# Patient Record
Sex: Male | Born: 1940 | Race: White | Hispanic: No | Marital: Married | Smoking: Never smoker
Health system: Southern US, Community
[De-identification: ages and names within clinical notes are randomized; demographics above are authoritative.]

## PROBLEM LIST (undated history)

## (undated) DIAGNOSIS — E119 Type 2 diabetes mellitus without complications: Secondary | ICD-10-CM

## (undated) DIAGNOSIS — I499 Cardiac arrhythmia, unspecified: Secondary | ICD-10-CM

## (undated) DIAGNOSIS — M179 Osteoarthritis of knee, unspecified: Secondary | ICD-10-CM

## (undated) DIAGNOSIS — I471 Supraventricular tachycardia, unspecified: Secondary | ICD-10-CM

## (undated) DIAGNOSIS — I482 Chronic atrial fibrillation, unspecified: Secondary | ICD-10-CM

## (undated) DIAGNOSIS — R0609 Other forms of dyspnea: Secondary | ICD-10-CM

## (undated) DIAGNOSIS — Z22322 Carrier or suspected carrier of Methicillin resistant Staphylococcus aureus: Secondary | ICD-10-CM

## (undated) DIAGNOSIS — I2510 Atherosclerotic heart disease of native coronary artery without angina pectoris: Secondary | ICD-10-CM

## (undated) DIAGNOSIS — I4892 Unspecified atrial flutter: Secondary | ICD-10-CM

## (undated) DIAGNOSIS — E785 Hyperlipidemia, unspecified: Secondary | ICD-10-CM

## (undated) DIAGNOSIS — M1710 Unilateral primary osteoarthritis, unspecified knee: Secondary | ICD-10-CM

## (undated) DIAGNOSIS — K219 Gastro-esophageal reflux disease without esophagitis: Secondary | ICD-10-CM

## (undated) DIAGNOSIS — I4719 Other supraventricular tachycardia: Secondary | ICD-10-CM

## (undated) DIAGNOSIS — C801 Malignant (primary) neoplasm, unspecified: Secondary | ICD-10-CM

## (undated) DIAGNOSIS — I10 Essential (primary) hypertension: Secondary | ICD-10-CM

## (undated) DIAGNOSIS — R0600 Dyspnea, unspecified: Secondary | ICD-10-CM

## (undated) HISTORY — DX: Other supraventricular tachycardia: I47.19

## (undated) HISTORY — DX: Unspecified atrial flutter: I48.92

## (undated) HISTORY — DX: Gastro-esophageal reflux disease without esophagitis: K21.9

## (undated) HISTORY — DX: Osteoarthritis of knee, unspecified: M17.9

## (undated) HISTORY — DX: Essential (primary) hypertension: I10

## (undated) HISTORY — DX: Chronic atrial fibrillation, unspecified: I48.20

## (undated) HISTORY — DX: Supraventricular tachycardia: I47.1

## (undated) HISTORY — DX: Dyspnea, unspecified: R06.00

## (undated) HISTORY — DX: Other forms of dyspnea: R06.09

## (undated) HISTORY — PX: CARDIAC SURGERY: SHX584

## (undated) HISTORY — DX: Carrier or suspected carrier of methicillin resistant Staphylococcus aureus: Z22.322

## (undated) HISTORY — PX: HEMORRHOID SURGERY: SHX153

## (undated) HISTORY — DX: Atherosclerotic heart disease of native coronary artery without angina pectoris: I25.10

## (undated) HISTORY — PX: CARDIAC ELECTROPHYSIOLOGY STUDY AND ABLATION: SHX1294

## (undated) HISTORY — DX: Supraventricular tachycardia, unspecified: I47.10

## (undated) HISTORY — PX: MOHS SURGERY: SHX181

## (undated) HISTORY — DX: Type 2 diabetes mellitus without complications: E11.9

## (undated) HISTORY — DX: Hyperlipidemia, unspecified: E78.5

## (undated) HISTORY — DX: Unilateral primary osteoarthritis, unspecified knee: M17.10

---

## 1991-04-13 HISTORY — PX: CARDIAC CATHETERIZATION: SHX172

## 1991-04-13 HISTORY — PX: CORONARY ARTERY BYPASS GRAFT: SHX141

## 2003-10-03 ENCOUNTER — Other Ambulatory Visit: Payer: Self-pay

## 2003-11-01 ENCOUNTER — Other Ambulatory Visit: Payer: Self-pay

## 2003-11-18 ENCOUNTER — Other Ambulatory Visit: Payer: Self-pay

## 2008-06-09 ENCOUNTER — Emergency Department: Payer: Self-pay | Admitting: Emergency Medicine

## 2008-06-13 ENCOUNTER — Ambulatory Visit: Payer: Self-pay | Admitting: Cardiology

## 2008-06-17 ENCOUNTER — Ambulatory Visit: Payer: Self-pay | Admitting: Internal Medicine

## 2008-06-24 ENCOUNTER — Ambulatory Visit: Payer: Self-pay

## 2008-06-24 ENCOUNTER — Encounter: Payer: Self-pay | Admitting: Cardiology

## 2008-06-24 ENCOUNTER — Ambulatory Visit: Payer: Self-pay | Admitting: Cardiology

## 2008-06-24 LAB — CONVERTED CEMR LAB
BUN: 16 mg/dL (ref 6–23)
Basophils Relative: 0.6 % (ref 0.0–3.0)
Cholesterol: 179 mg/dL (ref 0–200)
Creatinine, Ser: 1 mg/dL (ref 0.4–1.5)
Eosinophils Absolute: 0.1 10*3/uL (ref 0.0–0.7)
Eosinophils Relative: 3 % (ref 0.0–5.0)
GFR calc Af Amer: 96 mL/min
GFR calc non Af Amer: 79 mL/min
Glucose, Bld: 107 mg/dL — ABNORMAL HIGH (ref 70–99)
HCT: 39.1 % (ref 39.0–52.0)
Hemoglobin: 13.6 g/dL (ref 13.0–17.0)
INR: 2.1 — ABNORMAL HIGH (ref 0.8–1.0)
LDL Cholesterol: 119 mg/dL — ABNORMAL HIGH (ref 0–99)
MCV: 90.2 fL (ref 78.0–100.0)
Monocytes Absolute: 0.5 10*3/uL (ref 0.1–1.0)
Monocytes Relative: 10.8 % (ref 3.0–12.0)
Neutro Abs: 2.9 10*3/uL (ref 1.4–7.7)
Platelets: 190 10*3/uL (ref 150–400)
Potassium: 3.9 meq/L (ref 3.5–5.1)
RBC: 4.34 M/uL (ref 4.22–5.81)
Triglycerides: 150 mg/dL — ABNORMAL HIGH (ref 0–149)
WBC: 4.5 10*3/uL (ref 4.5–10.5)

## 2008-06-28 ENCOUNTER — Ambulatory Visit: Payer: Self-pay | Admitting: Internal Medicine

## 2008-07-01 ENCOUNTER — Ambulatory Visit: Payer: Self-pay | Admitting: Internal Medicine

## 2008-07-01 ENCOUNTER — Inpatient Hospital Stay (HOSPITAL_COMMUNITY): Admission: RE | Admit: 2008-07-01 | Discharge: 2008-07-02 | Payer: Self-pay | Admitting: Internal Medicine

## 2008-07-15 ENCOUNTER — Ambulatory Visit: Payer: Self-pay | Admitting: Internal Medicine

## 2008-11-25 ENCOUNTER — Encounter: Payer: Self-pay | Admitting: *Deleted

## 2009-01-29 DIAGNOSIS — I10 Essential (primary) hypertension: Secondary | ICD-10-CM | POA: Insufficient documentation

## 2009-01-29 DIAGNOSIS — E785 Hyperlipidemia, unspecified: Secondary | ICD-10-CM | POA: Insufficient documentation

## 2009-01-29 DIAGNOSIS — I4892 Unspecified atrial flutter: Secondary | ICD-10-CM | POA: Insufficient documentation

## 2009-01-29 DIAGNOSIS — I471 Supraventricular tachycardia: Secondary | ICD-10-CM

## 2009-01-29 DIAGNOSIS — I25810 Atherosclerosis of coronary artery bypass graft(s) without angina pectoris: Secondary | ICD-10-CM | POA: Insufficient documentation

## 2010-02-24 ENCOUNTER — Emergency Department: Payer: Self-pay | Admitting: Emergency Medicine

## 2010-07-23 LAB — CBC
HCT: 40.8 % (ref 39.0–52.0)
Hemoglobin: 14.3 g/dL (ref 13.0–17.0)
MCHC: 35 g/dL (ref 30.0–36.0)
RBC: 4.45 MIL/uL (ref 4.22–5.81)

## 2010-07-23 LAB — PROTIME-INR
INR: 2.3 — ABNORMAL HIGH (ref 0.00–1.49)
Prothrombin Time: 28.9 seconds — ABNORMAL HIGH (ref 11.6–15.2)

## 2010-08-25 NOTE — Progress Notes (Signed)
[facility]                  [facility] OFFICE NOTE   NAME:Snell, KLING                          MRN:          [id number]  DATE:06/17/2008                            DOB:          [birth year]    [name] is referred today by Dr. Shirlee Latch for evaluation of atrial  flutter.  The patient is a very pleasant 70 year old male with a long  history of coronary disease who underwent bypass surgery in 1993.  The  patient has done well since then, but noted several years ago to have  had atrial flutter which went away on its own.  He had recurrent atrial  flutter several weeks ago and this ultimately terminated.  He was seen  by Dr. Shirlee Latch on June 13, 2008, and was at that time in sinus rhythm and  is now referred for consideration for catheter ablation.  The patient  denies frank syncope, though when he goes in the atrial flutter, he  feels weak and tired and short of breath.   MEDICATIONS:  1. Atenolol 100 a day.  2. Potassium 20 a day.  3. Zoloft 50 a day.  4. Gemfibrozil 600 two tablets daily.  5. Enalapril 20 twice daily.  6. Diltiazem 120 a day.  7. Caduet 10/80 a day.  8. Hydrochlorothiazide 25 a day.  9. Aspirin 81 a day.  10.Coumadin as directed.   FAMILY HISTORY:  Notable for both parents being deceased.  His mother in  her 33s of being old and her father in his 47s of myocardial infarction.   MEDICATIONS:  As previously noted.   SOCIAL HISTORY:  The patient denies tobacco or ethanol abuse.  He drinks  one alcoholic beverage per day, though he has not been drinking at all  since he has started on Coumadin.  He is retired from [employer].  He is  married with 2 children.   REVIEW OF SYSTEMS:  Notable for some anxiety and allergic-type symptoms,  seasonal allergies.  He has complains of erectile dysfunction.  Otherwise, all systems were reviewed and negative except as noted in the  HPI.   PHYSICAL EXAMINATION:  GENERAL:  He is a  pleasant, well-appearing,  middle-aged man who looks younger than stated age.  VITAL SIGNS:  The blood pressure was 130/62, the pulse was 52 and  regular, the respirations were 18, the weight was 205 pounds.  NECK:  No jugular venous distention.  HEENT:  Normocephalic and atraumatic.  Pupils equal, round, and  reactive.  Oropharynx is moist.  Sclerae are anicteric.  He was wearing  glasses.  NECK:  No jugular venous distention.  There is no thyromegaly.  Trachea  is midline.  Carotids were 2+ and symmetric.  LUNGS:  Clear bilaterally to auscultation.  No wheezes, rales, or  rhonchi are present.  There is no increased work of breathing.  CARDIOVASCULAR:  Regular rate and rhythm.  Normal S1, S2.  No murmurs,  rubs, or gallops are present.  He was bradycardic.  PMI was not enlarged  nor laterally displaced.  ABDOMEN:  Soft, nontender.  There is no organomegaly.  The bowel  sounds  are present.  No rebound or guarding.  EXTREMITIES:  No cyanosis, clubbing, or edema.  Pulses were 2+ and  symmetric.  NEUROLOGIC:  Alert and oriented x3.  Cranial nerves are intact.  Strength was 5/5 and symmetric.  SKIN:  Normal.   EKG demonstrates sinus bradycardia with a prior inferior MI.  Previous  EKG demonstrates typical atrial flutter with predominantly 2:1 AV  conduction.   IMPRESSION:  1. Atrial flutter (recurrent) with a rapid ventricular response and      very symptomatic.  2. Chronic Coumadin therapy secondary to atrial flutter (recurrent)      with a rapid ventricular response and very symptomatic.  3. Coronary artery disease status post bypass surgery.   DISCUSSION:  I have discussed the treatment options with the patient.  The risks, benefits, goals, and expectations of the electrophysiologic  study and catheter ablation have been discussed with the patient.  He  would like to proceed with catheter ablation.  We will go ahead and make  sure his Coumadin is therapeutic and proceed with  ablation therapy once  we can demonstrate this.  Of course the patient has maintained sinus  rhythm on his own for the last week or so.  No TEE would be required as  he has been in sinus.     Doylene Canning. Ladona Ridgel, MD  Electronically Signed    GWT/MedQ  DD: 06/17/2008  DT: 06/18/2008  Job #: [id number]   cc:   Gustavo Lah, MD, [facility]

## 2010-08-25 NOTE — Op Note (Signed)
Walter Mosley, Walter Mosley                 ACCOUNT NO.:  [id number]   MEDICAL RECORD NO.:  [id number]          PATIENT TYPE:  INP   LOCATION:  4707                         FACILITY:  [facility]   PHYSICIAN:  Doylene Canning. Ladona Ridgel, MD    DATE OF BIRTH:  10-03-1940   DATE OF PROCEDURE:  07/01/2008  DATE OF DISCHARGE:                               OPERATIVE REPORT   PROCEDURE PERFORMED:  Electrophysiologic study and radiofrequency  catheter ablation of atrial flutter along with the atrioventricular  reentrant tachycardia.   INTRODUCTION:  The patient is a very pleasant 71 year old man who has a  history of coronary artery disease and is status post bypass surgery in  the past.  He developed symptomatic atrial flutter and was also  subsequently found to have no obvious ischemia on a stress test and is  now referred for catheter ablation of his atrial flutter.   PROCEDURE:  After informed consent was obtained, the patient was taken  to the diagnostic EP lab in a fasting state.  After usual preparation  and draping, intravenous fentanyl and midazolam were given for sedation.  A 6-French hexapolar catheter was inserted percutaneously into the right  jugular vein and advanced under fluoroscopic guidance to the coronary  sinus.  A 6-French quadripolar catheter was inserted percutaneously into  the right femoral vein and advanced into the His bundle region.  A 7-  French quadripolar ablation catheter was inserted percutaneously in the  right femoral vein and advanced to the right atrium.  Rapid atrial  pacing was carried out from the coronary sinus at paced cycle length of  600 msec and stepwise decreased down to 470 msec where AV Wenckebach was  demonstrated.  During rapid atrial pacing, the PR interval was less than  the RR interval and there was no inducible SVT.  Next, programmed atrial  stimulation was carried out from the CS of 700 msec with the S1-S2  interval stepwise decreased down to 420 msec where  the AV node ERP was  observed.  During programmed atrial stimulation, there were no AH jumps  and initially no echo beats.  At this point, additional rapid atrial  pacing was carried out starting at 290 msec and stepwise decreased down  to 110 msec resulting in the initiation of atrial flutter.  This was  typical counterclockwise tricuspid entry, tricuspid annular reentrant  atrial flutter with a cycle length of 240 msec.  The ablation catheter  was moved into the right atrium and just prior to initiation of RF  energy application, the patient spontaneously went into AFib.  After the  AFib did not terminate initially though it did initially go back and  forth in the atrial flutter, ibutilide 1 mg was given intravenously over  10 minutes resulting in restoration of sinus rhythm.  At this point  because of concerns of reducing AFib, pacing was carried out from the  coronary sinus and the ablation catheter was then maneuvered into the  usual atrial flutter isthmus.  A 16 RF energy applications were  subsequent delivered the atrial flutter isthmus resulting in the  creation of bidirectional atrial flutter isthmus block.  This was  demonstrated by stimulus to atrial activation time in the atrial flutter  isthmus of 160 msec.  At this point, rapid ventricular pacing was  carried out from the right ventricle at paced cycle length of 700 msec  and stepwise decreased down to 580 msec, which resulted in a VA jump in  initiation of SVT.  The earliest atrial activation of this the patient's  SVT, which was at cycle length of 540 msec, was inside the CS os at  about 6 o'clock in the LAO projection.  The CS proximal activation was  earlier than His A activation.  In addition, the ablation catheter at  this point was maneuvered into the slow pathway region and atrial  activation in the slow pathway and the fast pathway were late compared  to activation in the CS os.  Ventricular pacing during tachycardia  was  then carried out, which demonstrated preexcitation of the atrium during  PVCs placed at the time of His bundle refractoriness.  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  The ablation catheter was maneuvered during  tachycardia into the region of the posteroseptal space just inside the  os of the coronary sinus.  At this location, the atrial activation was  earlier than the CS 5-6 activation and earlier by far than the His  activation.  Three RF energy applications were delivered inside the  coronary sinus os, which resulted in rendering the SVT non-inducible.  At this point, additional rapid ventricular pacing and programmed  ventricular stimulation were carried out and now there was no inducible  SVT where as each would induce SVT prior to the ablation procedure.  At  this point, additional rapid atrial pacing was carried out from the  coronary sinus to confirm the atrial flutter isthmus conduction remained  blocked and at this point, the catheters were removed, hemostasis was  assured, and the patient was returned to his room in satisfactory  condition.  It should be noted that during this procedure because of the  very difficult atrial flutter isthmus and because of the second  tachycardia being a concealed right posteroseptal pathway, the procedure  was much longer than it would be expected and due to a degree of  difficulty having to ablate two reentrant circuits in one patient.   COMPLICATIONS:  Were no immediate procedure complications.   RESULTS:  A.  Baseline ECG.  The baseline ECG demonstrates sinus rhythm  with normal axis and intervals.  B.  Baseline intervals. The sinus node cycle length was 1090 msec.  The  PR interval 180 msec.  The QRS duration 130 msec.  The AH interval was  119 and the HV 35.  There was no significant change following  ablation.  C.  Rapid ventricular pacing.  Rapid atrial pacing was carried out from  the RV apex demonstrated a VA Wenckebach cycle length of 480 msec  following ablation.  D.  Programmed ventricular stimulation.  Programmed ventricular  stimulation was carried out from the RV apex at base drive cycle length  of 161 msec with the S1, S2 interval stepwise decreased down to 400 msec  where the retrograde AV node ERP was observed.  It should be noted that  prior to ablation, programmed ventricular stimulation resulted in the  initiation of SVT.  E.  Rapid  atrial pacing.  Rapid atrial pacing was carried out from the  coronary sinus and the high right atrium base drive cycle length of 161  msec and stepwise decreased to all way down to 210 msec.  This resulted  in the initiation of atrial flutter.  In addition, rapid atrial pacing  demonstrated an AV Wenckebach cycle length of 470 msec.  The PR interval  was equal to, but not greater than the RR interval just prior to  demonstrating AV Wenckebach.  F.  Programmed atrial stimulation.  Programmed atrial stimulation was  carried out from the coronary sinus and the high right atrium base drive  cycle lengths of 096 msec with the S1, S2 interval stepwise decreased  from 600 msec down to 400 msec where the AV node ERP was observed.  During programmed atrial stimulation, there were no AH jumps and no  inducible SVT.  G.  Arrhythmias observed.  1. Atrial flutter initiation with rapid atrial pacing.  Duration was      sustained, termination was spontaneous.  2. Atrial fibrillation initiation was spontaneous.  Duration was      sustained, Termination was with ibutilide.  3. AV reentrant tachycardia initiation was with rapid ventricular      pacing following ablation of the atrial flutter isthmus.  The      duration was sustained, termination was spontaneous, cycle length      was 540 milliseconds.      a.     RF energy application.  A total of 19 RF  energy applications       were delivered.  A 16 RF energy applications were delivered to the       atrial flutter isthmus resulting in the creation of atrial flutter       isthmus block.  Three RF energy applications were delivered to the       posteroseptal space just inside the coronary sinus os rendering       the patient's AV reentry tachycardia, non-inducible.   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      Doylene Canning. Ladona Ridgel, MD  Electronically Signed     GWT/MEDQ  D:  07/01/2008  T:  07/02/2008  Job:  [id number]   cc:   Marca Ancona, MD  Paul Half. Bernette Mayers, M.D., [facility].D.

## 2010-08-25 NOTE — Progress Notes (Signed)
[facility]                  [facility] OFFICE NOTE   NAME:Stults, CARN                          MRN:          [id number]  DATE:07/15/2008                            DOB:          11/20/1940    Mr. Rachel returns today for followup.  He is a very pleasant 70 year old  male with coronary disease who developed atrial flutter and underwent  left physiologic study and catheter ablation several weeks ago.  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  He  returns today for follow up.  The patient denies chest pain.  Denies  shortness of breath.  He has had no recurrent tachypalpitations since  his ablation.  He has been maintained on Coumadin.   MEDICINES:  1. Atenolol 100 a day.  2. Potassium 20 a day.  3. Zoloft 50 a day.  4. Gemfibrozil 600 daily 2 tablets daily.  5. Enalapril 20 twice a day.  6. Diltiazem 120 daily.  7. HCTZ 25 daily.  8. Coumadin as directed.  9. Aspirin 81 a day.   PHYSICAL EXAMINATION:  GENERAL:  He is a pleasant, well-appearing, 64-  year-old man, in no acute distress.  VITAL SIGNS:  The blood pressure was 142/73, the pulse 56 and regular,  the respirations were 18, the weight was 208 pounds. NECK:  No jugular  venous distention.  LUNGS:  Clear bilaterally to auscultation.  No wheezes, rales, or  rhonchi are present.  There is no increased work of breathing.  CARDIAC:  Regular rate and rhythm.  Normal S1 and S2.  ABDOMEN:  Soft, nontender.  EXTREMITIES:  Demonstrated no edema.   EKG demonstrates sinus bradycardia with normal axis intervals.   IMPRESSION:  1. Atrial flutter, status post ablation.  2. Supraventricular tachycardia, status post ablation.  3. Coronary disease.  4. Coumadin therapy secondary to atrial flutter, status post ablation.   DISCUSSION:  Mr. Riedesel today has been encouraged to stop his  Coumadin  therapy.  He will continue his other medical therapy.  I will see him  back as needed.  He will follow up with Dr. Shirlee Latch for his chronic  coronary disease.     Doylene Canning. Ladona Ridgel, MD  Electronically Signed    GWT/MedQ  DD: 07/15/2008  DT: 07/16/2008  Job #: [id number]   cc:   Gustavo Lah, MD

## 2010-08-25 NOTE — Assessment & Plan Note (Signed)
[facility] OFFICE NOTE   NAME:LUCKTheodore, Virgin                          MRN:          [id number]  DATE:06/13/2008                            DOB:          08-30-1940    PRIMARY CARE PHYSICIAN:  Dr. Bernette Mayers at [facility].   HISTORY OF PRESENT ILLNESS:  This is a 70 year old with history of  coronary artery bypass grafting, hypertension, and paroxysmal atrial  flutter who presents to Cardiology Clinic after an episode of atrial  flutter taking him to the emergency department at [facility] over the weekend.  The patient's cardiac history began  back in 1993 when he developed burning in his neck with exertion.  He  did have a heart catheterization at that time and was sent for coronary  artery bypass grafting at [facility].  We do not have the anatomy of the of  the grafts placed at this time.  He has had no anginal-type symptoms  actually since 1993.  His only cardiac complaint since his bypass  surgery has been episodes of paroxysmal atrial flutter.  The first  episode he had, he thinks, was about 5-6 years ago and was associated  with hypokalemia per his report.  Since then, he thinks he has had maybe  1 other episode that was prolonged and he also has had episodes of  fluttering palpitations that will last for a few hours at a time.  These  happen maybe about once a month.  He did develop an episode of  fluttering in his heart in the evening last Saturday.  He felt his heart  rate was going fast.  However, it resolved within with an hour or so,  typical of his usual palpitations.  The next morning, he was at church  and he felt the palpitations return again.  This time, the palpitations  did not resolve.  His heart was racing.  He was mildly lightheaded.  He  did not pass out.  He had no chest pain or neck pain.  He did go to  emergency department at [facility].  In  the  emergency department, he was noted to be in atrial flutter with rapid  ventricular response.  He was given diltiazem and started on a diltiazem  drip and he actually spontaneously converted to sinus rhythm after  receiving the diltiazem.  He was monitored in the emergency department  until that night and he was discharged home.  Since coming back home, he  has had no further episodes of heart fluttering.  Since his bypass  surgery, he has had good exercise tolerance.  He is really mainly only  limited by osteoarthritis in his knee.  He walks about 2 times a week  for exercise for maybe 20 minutes at a time, does not get short of  breath, and does not develop anginal-type symptoms.  He thinks his last  stress test was about 8 years ago, over at the [facility], and he  tells me that he was told it was  negative.   PAST MEDICAL HISTORY:  1. Coronary artery disease, status post coronary bypass grafting in      1993 at [facility].  His symptoms prior were exertional angina manifested      by burning in his lower neck.  He has had no heart catheterization      since that time.  He has had no chest/neck pain symptoms since that      time.  He did have a Myoview done about 8 years ago at the [facility] which per his report was normal.  2. Paroxysmal atrial flutter, the first episode was about 5 or 6 years      ago.  He had at least 1 prolonged episode prior to this past      weekend and he says that he gets monthly fluttering sensations that      can be prolonged up to about an hour at a time.  3. Hypertension.  4. Hyperlipidemia.  5. Gastroesophageal reflux disease.  6. Knee osteoarthritis.   SOCIAL HISTORY:  The patient lives with his wife in [place].  He has  2 sons.  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  He does not smoke.  He  drinks 1-2 beers a night.   FAMILY HISTORY:  The patient's father had MI at age of 50 and he also  had congestive heart failure.   EKG was  reviewed today, shows normal sinus rhythm with inferior Q  suggestive of an old inferior infarct.  EKG from [facility] was reviewed, it shows typical atrial flutter with  primarily 2-1 block.   Labs from the emergency department on February 28 show cardiac enzymes  negative x1 set.  Potassium 3.7, creatinine 1.21, LFT is  normal, TSH  normal.   REVIEW OF SYSTEMS:  Review of systems is negative except as noted in  history of present illness.   PHYSICAL EXAMINATION:  VITAL SIGNS:  Blood pressure is 110/60, heart  rate 57 and regular.  GENERAL:  This is a well-developed male, in no apparent stress.  NEUROLOGIC:  Alert and orient x3.  Normal affect.  NECK:  There is no JVD.  There is no thyromegaly or thyroid nodule.  LUNGS:  Clear to auscultation bilaterally with normal respiratory  effort.  HEENT:  Normal exam.  CARDIOVASCULAR:  Heart regular S1, S2.  There is no S3 or S4, there is a  1/6 holosystolic murmur at the apex.  There is no peripheral edema.  There are 2+ posterior tibial pulses bilaterally.  There is no carotid  bruits.  ABDOMEN:  Soft, nontender.  No hepatosplenomegaly.  EXTREMITIES:  No clubbing or cyanosis.  SKIN:  Normal exam.  MUSCULOSKELETAL:  Normal exam.   MEDICATIONS:  1. Atenolol 100 mg daily.  2. Potassium chloride 20 mEq daily.  3. Zoloft 50 mg daily.  4. Gemfibrozil 600 mg b.i.d.  5. Enalapril 20 mg b.i.d.  6. Diltiazem CD 120 mg daily.  7. Caduet 10/80 daily.  8. Aspirin 81 mg daily.  9. Hydrochlorothiazide 25 mg daily.   ASSESSMENT/PLAN:  This is a 70 year old with history of coronary artery  disease status post coronary bypass grafting, hypertension,  hyperlipidemia, and paroxysmal atrial flutter who presents to Cardiology  Clinic for evaluation after recent episode of atrial flutter with rapid  ventricular response.  1. Atrial flutter.  The patient does appear to have had periodic      episodes of atrial  flutter over the  years.  Initially it occurred 5      or 6 years ago.  He has only had a couple of documented episodes.      However, he does have fluttering sensations when he feels his heart      racing that will happen about once a month that could possibly be      due to paroxysmal atrial flutter.  His episode this weekend was      prolonged, it was associated with some mild lightheadedness,      otherwise no symptoms.  I did talk to him a bit about strategies      for management of this.  He is on diltiazem and atenolol for rate      control.  He is not on Coumadin at this time, his CHADS2 score is 1      with his risk factor being hypertension.  He does seem to be fairly      worried about the episodes of atrial flutter, and he does want to      know if there is a way to prevent it from coming back.  I did      suggest an atrial flutter ablation would be an option.  I will      start him on Coumadin today as he wants to avoid the finite risk of      stroke that would be present even with his CHADS2 score of 1.  I am      also going to refer him to speak with one of our EP doctors.      Although his atrial flutter has not been extremely common in the      past, it still does create a risk of stroke in this gentleman and      he is quite active and would like to avoid long-term Coumadin.  I      think atrial flutter ablation would be an option.  If he undergoes      successful ablation, we could presumably have him on Coumadin for      about a month after the procedure and then hopefully he would be      able to stop Coumadin unless he has a recurrence in the future.  He      will continue on his atenolol and diltiazem.  We will obtain an      echocardiogram.  2. Coronary artery disease.  The patient has actually been quite      asymptomatic from the standpoint of coronary artery disease since      his bypass surgery in 1993.  It has been about 8 years since his      last stress test and it has been  17 years since his surgery, so I      do think it will be reasonable to obtain an exercise treadmill      Myoview to assess for any large areas of ischemia.  He is on a good      medical regimen and will continue on his aspirin, ACE inhibitor,      beta-blocker, and statin.  3. Hypertension.  The patient's blood pressure is under excellent      control.  He will continue on his hydrochlorothiazide, Norvasc,      enalapril, and atenolol.  4. Hyperlipidemia.  The patient is on gemfibrozil and Lipitor.  We      will check his lipids when he  comes to get his echocardiogram and      his Myoview.   FOLLOWUP:  The patient will follow up back here in clinic in 1 month.  We will talk about the results of his testing and about and we will  await his appointment with EP.     Marca Ancona, MD  Electronically Signed    DM/MedQ  DD: 06/13/2008  DT: 06/14/2008  Job #: [id number]   cc:   Gustavo Lah, MD, [facility]

## 2010-08-25 NOTE — Discharge Summary (Signed)
Walter Mosley, Walter Mosley                 ACCOUNT NO.:  [id number]   MEDICAL RECORD NO.:  [id number]          PATIENT TYPE:  INP   LOCATION:  4707                         FACILITY:  [facility]   PHYSICIAN:  Doylene Canning. Ladona Ridgel, MD    DATE OF BIRTH:  [birth year]   DATE OF ADMISSION:  07/01/2008  DATE OF DISCHARGE:  07/02/2008                               DISCHARGE SUMMARY   This patient has no known drug allergies.   Time for this dictation greater than 45 minutes which includes  explanation to the patient.   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   SECONDARY DIAGNOSES:  1. History of paroxysmal atrial flutter.      a.     Original diagnosis in the setting of hypokalemia.      b.     Many episodes terminated with cough or Valsalva techniques.      c.     The last prolonged episode was about 3 weeks ago.  The       patient came to the emergency room, had pharmacologic conversion       with IV Cardizem.  2. Echocardiogram on June 24, 2008, ejection fraction 60%.  No left      ventricular wall motion abnormalities.  Trivial mitral      regurgitation, mild tricuspid regurgitation.  3. Myoview study on June 24, 2008, ejection fraction 59%.  No      infarct, no ischemia.  4. Three-vessel coronary artery disease, status post coronary artery      bypass graft surgery in 1993.  5. Hypertension.  6. Dyslipidemia.  7. Gastroesophageal reflux disease.   PROCEDURE:  On July 01, 2008, electrophysiology study with  radiofrequency catheter ablation of both atypical counterclockwise  isthmus-dependent atrial flutter which devolved to atrial fibrillation  and then to sinus rhythm on ibutilide.  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  Both of these rhythms now ablated by Dr. Ladona Ridgel.  The  patient observed 45 minutes without atrial flutter or isthmus  conduction.   BRIEF HISTORY:  Walter Mosley is a 70 year old male.  He is referred for  evaluation of atrial flutter.  He has a history of coronary artery  disease.  He underwent bypass surgery in 1993.   The patient has done well with regard to coronary artery disease since  then.  Several years ago, he began to have atrial flutter which would  self terminate.  He had recurrent atrial flutter several weeks ago which  required admission to the emergency room and IV Cardizem before he could  experience pharmacologic conversion to sinus rhythm.  The patient denies  frank syncope.  In atrial flutter, he feels weak, tired, and short of  breath.   The treatment goals have been discussed with the patient including  electrophysiology study and  radiofrequency catheter ablation.  We will  have his Coumadin therapeutic prior to the procedure, although he has  paroxysmal atrial flutter.  The patient will present first available  opportunity.  Once again his primary caregiver, Dr. Gustavo Lah at the  [facility].   HOSPITAL COURSE:  The patient presents electively on July 01, 2008.  He  underwent electrophysiology study, 2 atrial arrhythmias were identified.  There was a counterclockwise caval tricuspid isthmus dependent atrial  flutter and also a SVT, AVRT preceding by concealed accessory pathway.  Both of these were successfully ablated.  The patient has had no  recurrence of atrial arrhythmias in the postprocedure period and he will  be discharged on postprocedure day #1.   DISCHARGE MEDICATIONS:  1. Enalapril 20 mg twice daily.  2. Coumadin 5 mg daily.  3. Atenolol 100 mg daily.  4. Zoloft 50 mg daily.  5. Gemfibrozil 600 mg 2 tabs daily.  6. Diltiazem ER 120 mg  daily.  7. Hydrochlorothiazide 25 mg daily.  8. Enteric-coated aspirin 81 mg daily.  9. Norvasc 10 mg daily.  10.Crestor 20 mg at bedtime.  11.Potassium chloride 20 mEq daily.   He follows up with Dr. Ladona Ridgel at the [facility] office of [facility] on Monday, July 15, 2008, at noon.   LABORATORY STUDIES:  Pertinent to this admission were drawn on June 24, 2008:  Total cholesterol is 179, triglycerides 150, HDL cholesterol is  30, and LDL cholesterol 119.  Protime 21.9 and INR 2.1.  White cells  4.5, hemoglobin 13.6, hematocrit 39.1, and platelets of 190.  Sodium  140, potassium 3.9, chloride 105, carbonate 28, glucose 107, BUN 16, and  creatinine 1.0.      Maple Mirza, PA      Doylene Canning. Ladona Ridgel, MD  Electronically Signed    GM/MEDQ  D:  07/02/2008  T:  07/03/2008  Job:  [id number]   cc:   Marca Ancona, MD  Gustavo Lah, MD

## 2010-10-01 ENCOUNTER — Encounter: Payer: Self-pay | Admitting: Cardiovascular Disease

## 2012-11-15 ENCOUNTER — Encounter: Payer: Self-pay | Admitting: *Deleted

## 2012-11-16 ENCOUNTER — Ambulatory Visit (INDEPENDENT_AMBULATORY_CARE_PROVIDER_SITE_OTHER): Payer: Medicare Other | Admitting: Cardiovascular Disease

## 2012-11-16 ENCOUNTER — Encounter: Payer: Self-pay | Admitting: Cardiovascular Disease

## 2012-11-16 VITALS — BP 152/73 | HR 56 | Ht 76.0 in | Wt 206.5 lb

## 2012-11-16 DIAGNOSIS — I4892 Unspecified atrial flutter: Secondary | ICD-10-CM

## 2012-11-16 DIAGNOSIS — I10 Essential (primary) hypertension: Secondary | ICD-10-CM

## 2012-11-16 DIAGNOSIS — R002 Palpitations: Secondary | ICD-10-CM

## 2012-11-16 DIAGNOSIS — I2510 Atherosclerotic heart disease of native coronary artery without angina pectoris: Secondary | ICD-10-CM | POA: Insufficient documentation

## 2012-11-16 DIAGNOSIS — I25810 Atherosclerosis of coronary artery bypass graft(s) without angina pectoris: Secondary | ICD-10-CM

## 2012-11-16 DIAGNOSIS — R55 Syncope and collapse: Secondary | ICD-10-CM

## 2012-11-16 DIAGNOSIS — R42 Dizziness and giddiness: Secondary | ICD-10-CM

## 2012-11-16 NOTE — Assessment & Plan Note (Signed)
His blood pressure might go out after stopping diltiazem. I will have to clarify if he is taking enalapril.

## 2012-11-16 NOTE — Patient Instructions (Addendum)
Your physician has requested that you have an echocardiogram. Echocardiography is a painless test that uses sound waves to create images of your heart. It provides your doctor with information about the size and shape of your heart and how well your heart's chambers and valves are working. This procedure takes approximately one hour. There are no restrictions for this procedure.  Your physician has recommended that you wear a holter monitor. Holter monitors are medical devices that record the heart's electrical activity. Doctors most often use these monitors to diagnose arrhythmias. Arrhythmias are problems with the speed or rhythm of the heartbeat. The monitor is a small, portable device. You can wear one while you do your normal daily activities. This is usually used to diagnose what is causing palpitations/syncope (passing out).  Stop taking Diltiazem Continue other medications.   Follow up in 1 month

## 2012-11-16 NOTE — Assessment & Plan Note (Signed)
He has no symptoms suggestive of angina. Continue medical therapy. 

## 2012-11-16 NOTE — Assessment & Plan Note (Signed)
Status post catheter ablation in 2000 and then with no evidence of recurrent flutter.

## 2012-11-16 NOTE — Assessment & Plan Note (Addendum)
The patient has been experiencing recent episodes of presyncope with documented sinus bradycardia. Most likely this is the culprit for his symptoms. His symptoms improved after decreasing the dose of atenolol to 50 mg once daily but did not resolve. He is also on diltiazem extended release 120 mg once daily. He is not orthostatic today. Due to his bradycardia and the fact that he is on 2 calcium channel blockers, I will go ahead and stop diltiazem. Continue other medications. I will request a 48-hour Holter monitor to ensure no other associated arrhythmia. I will also obtain an echocardiogram to evaluate LV systolic function which has not been done in the last few years.

## 2012-11-16 NOTE — Progress Notes (Signed)
HPI  [name] is a pleasant 72 year old male who was referred by Dr. Hessie Diener at [facility] for evaluation of dizziness and bradycardia. He has a long history of coronary disease who underwent bypass surgery in 1993. No ischemic cardiac events since then. He had atrial flutter in 2010 in and underwent successful ablation by Dr. Ladona Ridgel without any recurrent arrhythmia.  He has been doing well up until recently when he started having episodes of extreme dizziness to the point where he feels close to passing out. This usually happens in the standing position. He does not feel palpitations or tachycardia. He denies any chest pain or significant dyspnea. He was noted to be bradycardic recently with a heart rate of 51 beats per minute. The dose of atenolol was decreased to 50 mg once daily. He felt better after that stent with residual dizziness.  No Known Allergies   Current Outpatient Prescriptions on File Prior to Visit  Medication Sig Dispense Refill  . atenolol (TENORMIN) 50 MG tablet Take 50 mg by mouth daily.       No current facility-administered medications on file prior to visit.     Past Medical History  Diagnosis Date  . Hyperlipidemia     Mixed  . GERD (gastroesophageal reflux disease)   . Osteoarthritis, knee   . Coronary artery disease     Artery bypass graft  . Atrial flutter     s/p ablation  . PSVT (paroxysmal supraventricular tachycardia)   . PAT (paroxysmal atrial tachycardia)   . Hypertension     Unspecified     Past Surgical History  Procedure Laterality Date  . Hemorrhoid surgery    . Coronary artery bypass graft  1993    CABG x 5 @ [facility]  . Cardiac electrophysiology study and ablation    . Cardiac catheterization  1993     Family History  Problem Relation Age of Onset  . Coronary artery disease Other   . Heart disease Father   . Heart attack Father 44    MI     History   Social History  . Marital Status: Married   Spouse Name: N/A    Number of Children: N/A  . Years of Education: N/A   Occupational History  . Retired    Social History Main Topics  . Smoking status: Never Smoker   . Smokeless tobacco: Not on file  . Alcohol Use: No  . Drug Use: No  . Sexually Active: Not on file   Other Topics Concern  . Not on file   Social History Narrative   Gets regular exercise.     ROS Constitutional: Negative for fever, chills, diaphoresis, activity change, appetite change and fatigue.  HENT: Negative for hearing loss, nosebleeds, congestion, sore throat, facial swelling, drooling, trouble swallowing, neck pain, voice change, sinus pressure and tinnitus.  Eyes: Negative for photophobia, pain, discharge and visual disturbance.  Respiratory: Negative for apnea, cough, chest tightness, shortness of breath and wheezing.  Cardiovascular: Negative for chest pain, palpitations and leg swelling.  Gastrointestinal: Negative for nausea, vomiting, abdominal pain, diarrhea, constipation, blood in stool and abdominal distention.  Genitourinary: Negative for dysuria, urgency, frequency, hematuria and decreased urine volume.  Musculoskeletal: Negative for myalgias, back pain, joint swelling, arthralgias and gait problem.  Skin: Negative for color change, pallor, rash and wound.  Neurological: Negative for  tremors, seizures,  speech difficulty, weakness, light-headedness, numbness and headaches.  Psychiatric/Behavioral: Negative for suicidal ideas, hallucinations, behavioral  problems and agitation. The patient is not nervous/anxious.     PHYSICAL EXAM   BP 152/73  Pulse 56  Ht 6\' 4"  (1.93 m)  Wt 206 lb 8 oz (93.668 kg)  BMI 25.15 kg/m2 Constitutional: He is oriented to person, place, and time. He appears well-developed and well-nourished. No distress.  HENT: No nasal discharge.  Head: Normocephalic and atraumatic.  Eyes: Pupils are equal and round. Right eye exhibits no discharge. Left eye exhibits no  discharge.  Neck: Normal range of motion. Neck supple. No JVD present. No thyromegaly present. No carotid bruits Cardiovascular: Bradycardic, regular rhythm, normal heart sounds . Exam reveals no gallop and no friction rub. No murmur heard.  Pulmonary/Chest: Effort normal and breath sounds normal. No stridor. No respiratory distress. He has no wheezes. He has no rales. He exhibits no tenderness.  Abdominal: Soft. Bowel sounds are normal. He exhibits no distension. There is no tenderness. There is no rebound and no guarding.  Musculoskeletal: Normal range of motion. He exhibits no edema and no tenderness.  Neurological: He is alert and oriented to person, place, and time. Coordination normal.  Skin: Skin is warm and dry. No rash noted. He is not diaphoretic. No erythema. No pallor.  Psychiatric: He has a normal mood and affect. His behavior is normal. Judgment and thought content normal.       EKG: Sinus  Bradycardia  - occasional PAC    # PACs = 1. -Nonspecific QRS widening.   BORDERLINE    ASSESSMENT AND PLAN

## 2012-11-23 DIAGNOSIS — R55 Syncope and collapse: Secondary | ICD-10-CM

## 2012-11-24 ENCOUNTER — Telehealth: Payer: Self-pay

## 2012-11-24 NOTE — Telephone Encounter (Signed)
Message copied by Marilynne Halsted on Fri Nov 24, 2012  9:00 AM ------      Message from: Antonieta Iba      Created: Thu Nov 23, 2012  6:12 PM       EKG looks okay to me ------

## 2012-11-24 NOTE — Telephone Encounter (Signed)
Spoke w/ pt.  Aware of results.

## 2012-11-28 ENCOUNTER — Other Ambulatory Visit (INDEPENDENT_AMBULATORY_CARE_PROVIDER_SITE_OTHER): Payer: Medicare Other

## 2012-11-28 ENCOUNTER — Other Ambulatory Visit: Payer: Self-pay

## 2012-11-28 DIAGNOSIS — I25810 Atherosclerosis of coronary artery bypass graft(s) without angina pectoris: Secondary | ICD-10-CM

## 2012-11-28 DIAGNOSIS — R42 Dizziness and giddiness: Secondary | ICD-10-CM

## 2012-11-28 DIAGNOSIS — R55 Syncope and collapse: Secondary | ICD-10-CM

## 2012-11-29 ENCOUNTER — Encounter (INDEPENDENT_AMBULATORY_CARE_PROVIDER_SITE_OTHER): Payer: Medicare Other

## 2012-11-29 ENCOUNTER — Other Ambulatory Visit: Payer: Self-pay

## 2012-11-29 DIAGNOSIS — R55 Syncope and collapse: Secondary | ICD-10-CM

## 2012-12-05 ENCOUNTER — Encounter: Payer: Self-pay | Admitting: Cardiovascular Disease

## 2012-12-05 ENCOUNTER — Ambulatory Visit (INDEPENDENT_AMBULATORY_CARE_PROVIDER_SITE_OTHER): Payer: Medicare Other | Admitting: Cardiovascular Disease

## 2012-12-05 VITALS — BP 150/74 | HR 49 | Ht 76.0 in | Wt 208.5 lb

## 2012-12-05 DIAGNOSIS — I10 Essential (primary) hypertension: Secondary | ICD-10-CM

## 2012-12-05 DIAGNOSIS — I480 Paroxysmal atrial fibrillation: Secondary | ICD-10-CM

## 2012-12-05 DIAGNOSIS — I2510 Atherosclerotic heart disease of native coronary artery without angina pectoris: Secondary | ICD-10-CM

## 2012-12-05 DIAGNOSIS — I4891 Unspecified atrial fibrillation: Secondary | ICD-10-CM

## 2012-12-05 NOTE — Patient Instructions (Addendum)
Labs today.  We will call about starting a blood thinner once I review labs.   Follow up in 3 months.

## 2012-12-05 NOTE — Assessment & Plan Note (Addendum)
Holter monitor showed episodes of atrial fibrillation with controlled ventricular response. CAHDS VASc score is 3. He is thus a modest risk for thromboembolic complications. There is no contraindication for anticoagulation. I discussed different options for anticoagulation including warfarin or NOACs.  I will check basic metabolic profile to determine his renal function and then decide whether to start him on Xarelto  or Eliquis.  Aspirin will be stopped after that.  Dizziness was likely not related to this but  to bradycardia which improved significantly after stopping diltiazem. Continue to monitor symptoms.

## 2012-12-05 NOTE — Assessment & Plan Note (Signed)
Blood pressure is mildly elevated he appears to be nervous. Continue to monitor.

## 2012-12-05 NOTE — Assessment & Plan Note (Signed)
No symptoms of angina. Continue medical therapy. 

## 2012-12-05 NOTE — Progress Notes (Signed)
HPI  Walter Mosley is a pleasant 72 year old male who is here today for followup visit regarding dizziness and bradycardia. He has a long history of coronary disease s/p bypass surgery in 1993. No ischemic cardiac events since then. He had atrial flutter in 2010  and underwent successful ablation by Dr. Ladona Ridgel without any recurrent arrhythmia.  He has been doing well up until recently when he started having episodes of extreme dizziness to the point where he feels close to passing out. This usually happens in the standing position. He does not feel palpitations or tachycardia. He denies any chest pain or significant dyspnea. He was noted to be bradycardic recently with a heart rate of 51 beats per minute. The dose of atenolol was decreased to 50 mg once daily. He felt better after that but continued to have residual dizziness. During his initial visit with me, his heart rate was 56 beats per minute. Thus, I stopped diltiazem and kept him on atenolol 50 mg once daily. I scheduled him for an echocardiogram which showed normal LV systolic function, mild left ventricular hypertrophy and mildly dilated left atrium. He was noted to be bradycardic during the echocardiogram with heart rates ranging from 45-52 beats per minute. Holter monitor showed sinus rhythm with evidence of paroxysmal atrial fibrillation with no atrial flutter. He did have sinus bradycardia and 2 pauses less than 3 seconds in duration during sleeping hours. Average heart rate was 58 beats per minute. Since stopping diltiazem, he reports significant improvement in symptoms and almost complete resolution of dizziness.    No Known Allergies   Current Outpatient Prescriptions on File Prior to Visit  Medication Sig Dispense Refill  . amLODipine (NORVASC) 10 MG tablet Take 10 mg by mouth daily.       [name] aspirin 81 MG tablet Take 81 mg by mouth daily.      [name] atenolol (TENORMIN) 50 MG tablet Take 50 mg by mouth daily.      [name] atorvastatin  (LIPITOR) 80 MG tablet Take 80 mg by mouth daily.       . Coenzyme Q10 (COQ-10) 30 MG CAPS Take 60 mg by mouth daily.      [name] gemfibrozil (LOPID) 600 MG tablet Take 600 mg by mouth 2 (two) times daily.       . hydrochlorothiazide (HYDRODIURIL) 25 MG tablet Take 25 mg by mouth daily.       Boris Lown Oil 1000 MG CAPS Take 2,000 mg by mouth daily.      . potassium chloride (K-DUR) 10 MEQ tablet Take 20 mEq by mouth daily.       No current facility-administered medications on file prior to visit.     Past Medical History  Diagnosis Date  . Hyperlipidemia     Mixed  . GERD (gastroesophageal reflux disease)   . Osteoarthritis, knee   . Coronary artery disease     Artery bypass graft  . Atrial flutter     s/p ablation  . PSVT (paroxysmal supraventricular tachycardia)   . PAT (paroxysmal atrial tachycardia)   . Hypertension     Unspecified     Past Surgical History  Procedure Laterality Date  . Hemorrhoid surgery    . Coronary artery bypass graft  1993    CABG x 5 @ [facility]  . Cardiac electrophysiology study and ablation    . Cardiac catheterization  1993     Family History  Problem Relation Age of Onset  . Coronary artery disease  Other   . Heart disease Father   . Heart attack Father 71    MI     History   Social History  . Marital Status: Married    Spouse Name: N/A    Number of Children: N/A  . Years of Education: N/A   Occupational History  . Retired    Social History Main Topics  . Smoking status: Never Smoker   . Smokeless tobacco: Not on file  . Alcohol Use: No  . Drug Use: No  . Sexual Activity: Not on file   Other Topics Concern  . Not on file   Social History Narrative   Gets regular exercise.      PHYSICAL EXAM   BP 150/74  Ht 6\' 4"  (1.93 m)  Wt 208 lb 8 oz (94.575 kg)  BMI 25.39 kg/m2 Constitutional: He is oriented to person, place, and time. He appears well-developed and well-nourished. No distress.  HENT: No nasal discharge.  Head:  Normocephalic and atraumatic.  Eyes: Pupils are equal and round. Right eye exhibits no discharge. Left eye exhibits no discharge.  Neck: Normal range of motion. Neck supple. No JVD present. No thyromegaly present. No carotid bruits Cardiovascular: Bradycardic, regular rhythm, normal heart sounds . Exam reveals no gallop and no friction rub. No murmur heard.  Pulmonary/Chest: Effort normal and breath sounds normal. No stridor. No respiratory distress. He has no wheezes. He has no rales. He exhibits no tenderness.  Abdominal: Soft. Bowel sounds are normal. He exhibits no distension. There is no tenderness. There is no rebound and no guarding.  Musculoskeletal: Normal range of motion. He exhibits no edema and no tenderness.  Neurological: He is alert and oriented to person, place, and time. Coordination normal.  Skin: Skin is warm and dry. No rash noted. He is not diaphoretic. No erythema. No pallor.  Psychiatric: He has a normal mood and affect. His behavior is normal. Judgment and thought content normal.        ASSESSMENT AND PLAN

## 2012-12-06 ENCOUNTER — Telehealth: Payer: Self-pay

## 2012-12-06 DIAGNOSIS — I4892 Unspecified atrial flutter: Secondary | ICD-10-CM

## 2012-12-06 DIAGNOSIS — I25810 Atherosclerosis of coronary artery bypass graft(s) without angina pectoris: Secondary | ICD-10-CM

## 2012-12-06 LAB — BASIC METABOLIC PANEL
BUN/Creatinine Ratio: 13 (ref 10–22)
BUN: 13 mg/dL (ref 8–27)
Chloride: 99 mmol/L (ref 97–108)
Creatinine, Ser: 1.04 mg/dL (ref 0.76–1.27)
GFR calc Af Amer: 82 mL/min/{1.73_m2} (ref >59)
GFR calc non Af Amer: 71 mL/min/{1.73_m2} (ref >59)

## 2012-12-06 LAB — PROTIME-INR: Prothrombin Time: 10.1 s (ref 9.1–12.0)

## 2012-12-06 MED ORDER — RIVAROXABAN 20 MG PO TABS: 20.0000 mg | ORAL_TABLET | Freq: Every day | ORAL | Status: DC

## 2012-12-06 NOTE — Telephone Encounter (Signed)
Pt informed of results and to start Xarelto 20 mg daily w/ supper and to stop aspirin.  Pt verbalizes understanding.

## 2012-12-06 NOTE — Telephone Encounter (Signed)
Message copied by Marilynne Halsted on Wed Dec 06, 2012  9:03 AM ------      Message from: Lorine Bears A      Created: Wed Dec 06, 2012  8:07 AM       Inform patient that labs were normal. Start Xarelto 20 mg once daily with an evening meal. # 30 with 6 refills. Stop Aspirin. ------

## 2012-12-07 ENCOUNTER — Telehealth: Payer: Self-pay | Admitting: *Deleted

## 2012-12-07 NOTE — Telephone Encounter (Signed)
Received fax for Prior Authorization for Xarelto 20 mg qd. Awaiting MD signature to fax to [facility].

## 2012-12-14 NOTE — Telephone Encounter (Signed)
Faxed prior authorization form to [facility] for Xarelto 20 mg. Awaiting approval.

## 2012-12-15 ENCOUNTER — Other Ambulatory Visit: Payer: Self-pay | Admitting: *Deleted

## 2012-12-15 DIAGNOSIS — I25810 Atherosclerosis of coronary artery bypass graft(s) without angina pectoris: Secondary | ICD-10-CM

## 2012-12-15 DIAGNOSIS — I4892 Unspecified atrial flutter: Secondary | ICD-10-CM

## 2012-12-15 MED ORDER — RIVAROXABAN 20 MG PO TABS: 20.0000 mg | ORAL_TABLET | Freq: Every day | ORAL | Status: DC

## 2012-12-15 NOTE — Telephone Encounter (Signed)
Pt has been approved for Xarelto 20 mg qd for 12 months through 12/14/12 - 12/14/13 under Medicare Part D benefit.

## 2012-12-15 NOTE — Telephone Encounter (Signed)
Refilled Xarelto sent to [facility].

## 2012-12-18 ENCOUNTER — Ambulatory Visit: Payer: Medicare Other | Admitting: Cardiovascular Disease

## 2013-01-05 ENCOUNTER — Telehealth: Payer: Self-pay

## 2013-01-05 DIAGNOSIS — K068 Other specified disorders of gingiva and edentulous alveolar ridge: Secondary | ICD-10-CM

## 2013-01-05 NOTE — Telephone Encounter (Signed)
Spoke w/ pt.  He will come over on Monday to have blood drawn. He will see about getting an appt with his dentist, but is concerned that it won't be able to get an appt soon.  He will take aspirin until he gets his lab results back and hears from [place] with instructions.

## 2013-01-05 NOTE — Telephone Encounter (Signed)
Check CBC and BMP to make sure everything is stable.  He should go see a dentist as well to check his gums.  Switch from Xarelto to Aspirin just for few days. I want him to go back on Xarelto once we make sure his labs are ok.

## 2013-01-05 NOTE — Telephone Encounter (Signed)
Spoke w/ pt.  States that his gums have been bleeding, esp when he brushes his teeth since starting Xarelto. Denies coughing up blood, denies blood in stool, but says that there is a small amount of blood on his pillow case for the past few morning.  Wants to know if he should go back on aspirin. Takes xarelto in evening, so has not had today's dose.

## 2013-01-08 ENCOUNTER — Ambulatory Visit (INDEPENDENT_AMBULATORY_CARE_PROVIDER_SITE_OTHER): Payer: Medicare Other

## 2013-01-08 DIAGNOSIS — K068 Other specified disorders of gingiva and edentulous alveolar ridge: Secondary | ICD-10-CM

## 2013-01-08 DIAGNOSIS — K055 Other periodontal diseases: Secondary | ICD-10-CM

## 2013-01-09 LAB — CBC WITH DIFFERENTIAL/PLATELET
Basophils Absolute: 0 10*3/uL (ref 0.0–0.2)
Eos: 3 %
HCT: 38.4 % (ref 37.5–51.0)
Hemoglobin: 13.3 g/dL (ref 12.6–17.7)
Immature Granulocytes: 0 %
Lymphocytes Absolute: 1.6 10*3/uL (ref 0.7–3.1)
MCHC: 34.6 g/dL (ref 31.5–35.7)
MCV: 85 fL (ref 79–97)
Monocytes Absolute: 0.5 10*3/uL (ref 0.1–0.9)
Neutrophils Absolute: 4 10*3/uL (ref 1.4–7.0)
RDW: 13.7 % (ref 12.3–15.4)
WBC: 6.4 10*3/uL (ref 3.4–10.8)

## 2013-01-09 LAB — BASIC METABOLIC PANEL
BUN/Creatinine Ratio: 15 (ref 10–22)
BUN: 14 mg/dL (ref 8–27)
CO2: 27 mmol/L (ref 18–29)
Creatinine, Ser: 0.94 mg/dL (ref 0.76–1.27)
GFR calc Af Amer: 93 mL/min/{1.73_m2} (ref >59)
GFR calc non Af Amer: 80 mL/min/{1.73_m2} (ref >59)
Glucose: 115 mg/dL — ABNORMAL HIGH (ref 65–99)
Sodium: 141 mmol/L (ref 134–144)

## 2013-02-25 ENCOUNTER — Emergency Department: Payer: Self-pay | Admitting: Emergency Medicine

## 2013-02-25 LAB — CBC WITH DIFFERENTIAL/PLATELET
HGB: 14.5 g/dL (ref 13.0–18.0)
Lymphocyte #: 1.1 10*3/uL (ref 1.0–3.6)
Lymphocyte %: 19.7 %
MCH: 29.3 pg (ref 26.0–34.0)
Monocyte %: 9 %
Neutrophil %: 67.6 %
Platelet: 187 10*3/uL (ref 150–440)
RDW: 14.3 % (ref 11.5–14.5)
WBC: 5.6 10*3/uL (ref 3.8–10.6)

## 2013-02-25 LAB — PROTIME-INR
INR: 1.7
Prothrombin Time: 19.8 secs — ABNORMAL HIGH (ref 11.5–14.7)

## 2013-02-25 LAB — BASIC METABOLIC PANEL
Anion Gap: 4 — ABNORMAL LOW (ref 7–16)
Chloride: 102 mmol/L (ref 98–107)
Co2: 31 mmol/L (ref 21–32)
EGFR (African American): >60
EGFR (Non-African Amer.): >60
Sodium: 137 mmol/L (ref 136–145)

## 2013-02-25 LAB — APTT: Activated PTT: 43.2 secs — ABNORMAL HIGH (ref 23.6–35.9)

## 2013-02-26 ENCOUNTER — Telehealth: Payer: Self-pay

## 2013-02-26 ENCOUNTER — Ambulatory Visit (INDEPENDENT_AMBULATORY_CARE_PROVIDER_SITE_OTHER): Payer: Medicare Other | Admitting: Cardiovascular Disease

## 2013-02-26 ENCOUNTER — Encounter: Payer: Self-pay | Admitting: Cardiovascular Disease

## 2013-02-26 VITALS — BP 138/57 | HR 56 | Ht 76.0 in | Wt 205.5 lb

## 2013-02-26 DIAGNOSIS — R55 Syncope and collapse: Secondary | ICD-10-CM

## 2013-02-26 DIAGNOSIS — I10 Essential (primary) hypertension: Secondary | ICD-10-CM

## 2013-02-26 DIAGNOSIS — I2510 Atherosclerotic heart disease of native coronary artery without angina pectoris: Secondary | ICD-10-CM

## 2013-02-26 DIAGNOSIS — I4892 Unspecified atrial flutter: Secondary | ICD-10-CM

## 2013-02-26 DIAGNOSIS — I480 Paroxysmal atrial fibrillation: Secondary | ICD-10-CM

## 2013-02-26 DIAGNOSIS — I4891 Unspecified atrial fibrillation: Secondary | ICD-10-CM

## 2013-02-26 NOTE — Progress Notes (Signed)
HPI  [name] is a pleasant 72 year old male who is here today for followup visit . He has a long history of coronary disease s/p bypass surgery in 1993. No ischemic cardiac events since then. He had atrial flutter in 2010  and underwent successful ablation by Dr. Ladona Ridgel.  He had episodes of symptomatic bradycardia early this year.  This improved after stopping Diltiazem . Echocardiogram in 11/2012  showed normal LV systolic function, mild left ventricular hypertrophy and mildly dilated left atrium. He was noted to be bradycardic during the echocardiogram with heart rates ranging from 45-52 beats per minute. Holter monitor showed sinus rhythm with evidence of paroxysmal atrial fibrillation with no atrial flutter. He did have sinus bradycardia and 2 pauses less than 3 seconds in duration during sleeping hours. Average heart rate was 58 beats per minute. He was started on Xarelto for anticoagulation. Atenolol was decreased to 25 mg once daily. No dizziness since then. He had bleeding from his gums after starting Xarelto which resolved after stopping the medication for few days. We advised him to follow up with his dentist for evaluation but did do that. He had severe bleeding again from from gums yesterday. He went to [facility] and they could not stop the bleeding. Labs showed normal CBC and BMP. PT/PTT were elevated. He was transferred to [facility] but stopped bleeding on the way there. No intervention was needed.     Allergies  Allergen Reactions  . Xarelto [Rivaroxaban]     Bleeding gums     Current Outpatient Prescriptions on File Prior to Visit  Medication Sig Dispense Refill  . amLODipine (NORVASC) 10 MG tablet Take 10 mg by mouth daily.       [name] atenolol (TENORMIN) 50 MG tablet Take 25 mg by mouth daily.       [name] atorvastatin (LIPITOR) 80 MG tablet Take 80 mg by mouth daily.       . Coenzyme Q10 (COQ-10) 30 MG CAPS Take 60 mg by mouth daily.      [name] gemfibrozil (LOPID) 600 MG tablet Take 600 mg by  mouth 2 (two) times daily.       . hydrochlorothiazide (HYDRODIURIL) 25 MG tablet Take 25 mg by mouth daily.       . potassium chloride (K-DUR) 10 MEQ tablet Take 20 mEq by mouth daily.       No current facility-administered medications on file prior to visit.     Past Medical History  Diagnosis Date  . Hyperlipidemia     Mixed  . GERD (gastroesophageal reflux disease)   . Osteoarthritis, knee   . Coronary artery disease     Artery bypass graft  . Atrial flutter     s/p ablation  . PSVT (paroxysmal supraventricular tachycardia)   . PAT (paroxysmal atrial tachycardia)   . Hypertension     Unspecified     Past Surgical History  Procedure Laterality Date  . Hemorrhoid surgery    . Coronary artery bypass graft  1993    CABG x 5 @ [facility]  . Cardiac electrophysiology study and ablation    . Cardiac catheterization  1993     Family History  Problem Relation Age of Onset  . Coronary artery disease Other   . Heart disease Father   . Heart attack Father 63    MI     History   Social History  . Marital Status: Married    Spouse Name: N/A    Number  of Children: N/A  . Years of Education: N/A   Occupational History  . Retired    Social History Main Topics  . Smoking status: Never Smoker   . Smokeless tobacco: Not on file  . Alcohol Use: No  . Drug Use: No  . Sexual Activity: Not on file   Other Topics Concern  . Not on file   Social History Narrative   Gets regular exercise.      PHYSICAL EXAM   BP 138/57  Pulse 56  Ht 6\' 4"  (1.93 m)  Wt 93.214 kg (205 lb 8 oz)  BMI 25.02 kg/m2 Constitutional: He is oriented to person, place, and time. He appears well-developed and well-nourished. No distress.  HENT: No nasal discharge.  Head: Normocephalic and atraumatic.  Eyes: Pupils are equal and round. Right eye exhibits no discharge. Left eye exhibits no discharge.  Neck: Normal range of motion. Neck supple. No JVD present. No thyromegaly present. No carotid  bruits Cardiovascular: Bradycardic, regular rhythm, normal heart sounds . Exam reveals no gallop and no friction rub. No murmur heard.  Pulmonary/Chest: Effort normal and breath sounds normal. No stridor. No respiratory distress. He has no wheezes. He has no rales. He exhibits no tenderness.  Abdominal: Soft. Bowel sounds are normal. He exhibits no distension. There is no tenderness. There is no rebound and no guarding.  Musculoskeletal: Normal range of motion. He exhibits no edema and no tenderness.  Neurological: He is alert and oriented to person, place, and time. Coordination normal.  Skin: Skin is warm and dry. No rash noted. He is not diaphoretic. No erythema. No pallor.  Psychiatric: He has a normal mood and affect. His behavior is normal. Judgment and thought content normal.        ASSESSMENT AND PLAN

## 2013-02-26 NOTE — Patient Instructions (Signed)
Stay off Xarelto.  Start Aspirin 81 mg once daily.   Get your gums and teeth checked with your dentist and ask him to send [place] a report.   Your physician wants you to follow-up in: 4 months.  You will receive a reminder letter in the mail two months in advance. If you don't receive a letter, please call our office to schedule the follow-up appointment.

## 2013-02-26 NOTE — Telephone Encounter (Signed)
Pt states took a 325 mg aspirin , not realizing the mg, wants a nurse to call him and tell him if this was ok.

## 2013-02-26 NOTE — Telephone Encounter (Signed)
Spoke w/ pt.  He states that he accidentally took a 325mg  aspirin instead of 81mg . Instructed pt to resume 81 mg tomorrow.  Pt to call with any questions or concerns.

## 2013-03-01 NOTE — Assessment & Plan Note (Signed)
No further episodes since adjusting medications. Bradycardia likely contributed.

## 2013-03-01 NOTE — Assessment & Plan Note (Signed)
He continue to be in NSR. I think the burden of A-fib is overall low. I asked him to hold Xarelto for now until he sees his dentist and find out why he is having that much bleeding from his gums. Start Aspirin.  Reevaluate the possibility of starting anticoagulation maybe with a different agent in few months.

## 2013-03-01 NOTE — Assessment & Plan Note (Signed)
BP is well controlled on current medications.  

## 2013-03-01 NOTE — Assessment & Plan Note (Signed)
No angina. Continue medical therapy.  

## 2013-03-12 ENCOUNTER — Telehealth: Payer: Self-pay

## 2013-03-12 ENCOUNTER — Ambulatory Visit: Payer: Medicare Other | Admitting: Cardiovascular Disease

## 2013-03-12 NOTE — Telephone Encounter (Signed)
Pt left message stating that he is scheduled to have a tooth extracted. He reports that he has been off of his xarelto x 17 days and wants to know how much longer he should be off of it before he can have surgery. Please advise.  Thank you!

## 2013-03-12 NOTE — Telephone Encounter (Signed)
Spoke w/ pt.  He is agreeable to plan and will have tooth extracted as soon as he can.

## 2013-03-12 NOTE — Telephone Encounter (Signed)
He can have tooth extraction now. Xarelto has been out of his system for a while now. It only takes 2 days for the effect of Xarelto to disappear.

## 2013-07-02 ENCOUNTER — Ambulatory Visit (INDEPENDENT_AMBULATORY_CARE_PROVIDER_SITE_OTHER): Payer: Medicare Other | Admitting: Cardiovascular Disease

## 2013-07-02 ENCOUNTER — Telehealth: Payer: Self-pay

## 2013-07-02 ENCOUNTER — Encounter: Payer: Self-pay | Admitting: Cardiovascular Disease

## 2013-07-02 VITALS — BP 136/67 | HR 62 | Ht 76.0 in | Wt 206.5 lb

## 2013-07-02 DIAGNOSIS — I4891 Unspecified atrial fibrillation: Secondary | ICD-10-CM

## 2013-07-02 DIAGNOSIS — I480 Paroxysmal atrial fibrillation: Secondary | ICD-10-CM

## 2013-07-02 DIAGNOSIS — I10 Essential (primary) hypertension: Secondary | ICD-10-CM

## 2013-07-02 DIAGNOSIS — I25810 Atherosclerosis of coronary artery bypass graft(s) without angina pectoris: Secondary | ICD-10-CM

## 2013-07-02 DIAGNOSIS — E785 Hyperlipidemia, unspecified: Secondary | ICD-10-CM

## 2013-07-02 MED ORDER — APIXABAN 5 MG PO TABS: 5.0000 mg | ORAL_TABLET | Freq: Two times a day (BID) | ORAL | Status: DC

## 2013-07-02 NOTE — Progress Notes (Signed)
HPI  Walter Mosley is a pleasant 73 year old male who is here today for followup visit . Walter Mosley has a long history of coronary disease s/p bypass surgery in 1993. No ischemic cardiac events since then. Walter Mosley had atrial flutter in 2010  and underwent successful ablation by Dr. Lovena Le.  Walter Mosley had episodes of symptomatic bradycardia in 2014.  This improved after stopping Diltiazem . Echocardiogram in 11/2012  showed normal LV systolic function, mild left ventricular hypertrophy and mildly dilated left atrium. Walter Mosley was noted to be bradycardic during the echocardiogram with heart rates ranging from 45-52 beats per minute. Holter monitor showed sinus rhythm with evidence of paroxysmal atrial fibrillation with no atrial flutter. Walter Mosley did have sinus bradycardia and 2 pauses less than 3 seconds in duration during sleeping hours. Average heart rate was 58 beats per minute. Walter Mosley was started on Xarelto for anticoagulation. Atenolol was decreased to 25 mg once daily. No dizziness since then. Walter Mosley had bleeding from his gums after starting Xarelto which was significant enough to stop anticoagulation. Walter Mosley has been on aspirin since then with no bleeding issues. Walter Mosley saw his dentist recently. Walter Mosley is doing well and denies chest pain, dyspnea, palpitations or dizziness.   Allergies  Allergen Reactions  . Xarelto [Rivaroxaban]     Bleeding gums     Current Outpatient Prescriptions on File Prior to Visit  Medication Sig Dispense Refill  . amLODipine (NORVASC) 10 MG tablet Take 10 mg by mouth daily.       [name] aspirin 81 MG tablet Take 81 mg by mouth daily.      [name] atenolol (TENORMIN) 50 MG tablet Take 25 mg by mouth daily.       [name] atorvastatin (LIPITOR) 80 MG tablet Take 80 mg by mouth daily.       [name] gemfibrozil (LOPID) 600 MG tablet Take 600 mg by mouth 2 (two) times daily.       . hydrochlorothiazide (HYDRODIURIL) 25 MG tablet Take 25 mg by mouth daily.       . potassium chloride (K-DUR) 10 MEQ tablet Take 20 mEq by mouth daily.        No current facility-administered medications on file prior to visit.     Past Medical History  Diagnosis Date  . Hyperlipidemia     Mixed  . GERD (gastroesophageal reflux disease)   . Osteoarthritis, knee   . Coronary artery disease     Artery bypass graft  . Atrial flutter     s/p ablation  . PSVT (paroxysmal supraventricular tachycardia)   . PAT (paroxysmal atrial tachycardia)   . Hypertension     Unspecified     Past Surgical History  Procedure Laterality Date  . Hemorrhoid surgery    . Coronary artery bypass graft  1993    CABG x 5 @ [place]  . Cardiac electrophysiology study and ablation    . Cardiac catheterization  1993     Family History  Problem Relation Age of Onset  . Coronary artery disease Other   . Heart disease Father   . Heart attack Father 51    MI     History   Social History  . Marital Status: Married    Spouse Name: N/A    Number of Children: N/A  . Years of Education: N/A   Occupational History  . Retired    Social History Main Topics  . Smoking status: Never Smoker   . Smokeless tobacco: Not on file  .  Alcohol Use: No  . Drug Use: No  . Sexual Activity: Not on file   Other Topics Concern  . Not on file   Social History Narrative   Gets regular exercise.      PHYSICAL EXAM   BP 136/67  Pulse 62  Ht 6\' 4"  (1.93 m)  Wt 206 lb 8 oz (93.668 kg)  BMI 25.15 kg/m2 Constitutional: Walter Mosley is oriented to person, place, and time. Walter Mosley appears well-developed and well-nourished. No distress.  HENT: No nasal discharge.  Head: Normocephalic and atraumatic.  Eyes: Pupils are equal and round. Right eye exhibits no discharge. Left eye exhibits no discharge.  Neck: Normal range of motion. Neck supple. No JVD present. No thyromegaly present. No carotid bruits Cardiovascular: Bradycardic, irregular rhythm, normal heart sounds . Exam reveals no gallop and no friction rub. No murmur heard.  Pulmonary/Chest: Effort normal and breath sounds  normal. No stridor. No respiratory distress. Walter Mosley has no wheezes. Walter Mosley has no rales. Walter Mosley exhibits no tenderness.  Abdominal: Soft. Bowel sounds are normal. Walter Mosley exhibits no distension. There is no tenderness. There is no rebound and no guarding.  Musculoskeletal: Normal range of motion. Walter Mosley exhibits no edema and no tenderness.  Neurological: Walter Mosley is alert and oriented to person, place, and time. Coordination normal.  Skin: Skin is warm and dry. No rash noted. Walter Mosley is not diaphoretic. No erythema. No pallor.  Psychiatric: Walter Mosley has a normal mood and affect. His behavior is normal. Judgment and thought content normal.     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   ASSESSMENT AND PLAN

## 2013-07-02 NOTE — Telephone Encounter (Signed)
Pharmacist w [facility] called and states Eliquis for pt is $170 and pt cannot afford, would like to know if there is an alternative less expensive

## 2013-07-02 NOTE — Assessment & Plan Note (Signed)
Continue treatment with atorvastatin with a target LDL of less than 70. 

## 2013-07-02 NOTE — Assessment & Plan Note (Signed)
He is noted to be in atrial fibrillation today with controlled ventricular rate. He was overall asymptomatic. CHADS2 VASc score is 2. Thus, I think it's important to resume long-term anticoagulation. I elected to start him on Eliquis 5 mg twice daily and stop aspirin. If he develops any recurrent bleeding from his gums, I instructed him to see his dentist.

## 2013-07-02 NOTE — Assessment & Plan Note (Signed)
Blood pressure is well controlled on current medications. 

## 2013-07-02 NOTE — Assessment & Plan Note (Signed)
He has no symptoms of angina. Continue medical therapy. 

## 2013-07-02 NOTE — Patient Instructions (Signed)
Stop Aspirin.  Start Eliquis 5 mg twice daily.   Your physician wants you to follow-up in: 6 months.  You will receive a reminder letter in the mail two months in advance. If you don't receive a letter, please call our office to schedule the follow-up appointment.

## 2013-07-02 NOTE — Telephone Encounter (Signed)
We can try Xarelto again but at a smaller dose of 15 mg once daily given his previous bleeding.

## 2013-07-03 MED ORDER — RIVAROXABAN 15 MG PO TABS: 15.0000 mg | ORAL_TABLET | Freq: Every day | ORAL | Status: DC

## 2013-07-03 NOTE — Telephone Encounter (Signed)
Instructed patient that per Dr. Fletcher Anon "We can try Xarelto again but at a smaller dose of 15 mg once daily given his previous bleeding."  Patient verbalized understanding.

## 2013-07-04 ENCOUNTER — Telehealth: Payer: Self-pay | Admitting: *Deleted

## 2013-07-04 NOTE — Telephone Encounter (Signed)
LVM 3/25 

## 2013-07-04 NOTE — Telephone Encounter (Signed)
Pharmacist w/ Walter Mosley called and she  Stated copay $170 for Eloquis. And [facility] has same copay please advise

## 2013-07-06 NOTE — Telephone Encounter (Signed)
Patient called and stated he received assistance with Xarelto  He is choosing to stay on the Xarelto 15 mg once daily

## 2013-07-06 NOTE — Telephone Encounter (Signed)
Patient stopped by office to let Dr. Fletcher Anon know that the co pay for both Xarelto and Eliquis were out of his budget  I gave patient the patient assistance info for both meds and told him to call and let us know which one might work best for him  I also gave him one sample pack of Xarelto 15 mg tablets until he could decide

## 2013-12-31 ENCOUNTER — Encounter: Payer: Self-pay | Admitting: Cardiovascular Disease

## 2013-12-31 ENCOUNTER — Ambulatory Visit (INDEPENDENT_AMBULATORY_CARE_PROVIDER_SITE_OTHER): Payer: Medicare Other | Admitting: Cardiovascular Disease

## 2013-12-31 VITALS — BP 120/70 | HR 62 | Ht 76.0 in | Wt 201.8 lb

## 2013-12-31 DIAGNOSIS — I480 Paroxysmal atrial fibrillation: Secondary | ICD-10-CM

## 2013-12-31 DIAGNOSIS — E785 Hyperlipidemia, unspecified: Secondary | ICD-10-CM

## 2013-12-31 DIAGNOSIS — I10 Essential (primary) hypertension: Secondary | ICD-10-CM

## 2013-12-31 DIAGNOSIS — I482 Chronic atrial fibrillation, unspecified: Secondary | ICD-10-CM | POA: Insufficient documentation

## 2013-12-31 DIAGNOSIS — I4891 Unspecified atrial fibrillation: Secondary | ICD-10-CM

## 2013-12-31 DIAGNOSIS — I25810 Atherosclerosis of coronary artery bypass graft(s) without angina pectoris: Secondary | ICD-10-CM

## 2013-12-31 DIAGNOSIS — I4821 Permanent atrial fibrillation: Secondary | ICD-10-CM | POA: Insufficient documentation

## 2013-12-31 NOTE — Assessment & Plan Note (Signed)
Blood pressure is well controlled on current medications. 

## 2013-12-31 NOTE — Progress Notes (Signed)
HPI  Walter Mosley is a pleasant 73 year old male who is here today for followup visit regarding coronary artery disease and chronic atrial fibrillation . He has a long history of coronary disease s/p bypass surgery in 1993. No ischemic cardiac events since then. He had atrial flutter in 2010  and underwent successful ablation by Dr. Lovena Le.  He had episodes of symptomatic bradycardia in 2014.  This improved after stopping Diltiazem . Echocardiogram in 11/2012  showed normal LV systolic function, mild left ventricular hypertrophy and mildly dilated left atrium. He was noted to be bradycardic during the echocardiogram with heart rates ranging from 45-52 beats per minute. Holter monitor showed sinus rhythm with evidence of paroxysmal atrial fibrillation with no atrial flutter. He did have sinus bradycardia and 2 pauses less than 3 seconds in duration during sleeping hours. Average heart rate was 58 beats per minute. He was started on Xarelto for anticoagulation. Atenolol was decreased to 25 mg once daily but he went back on 50 mg daily. No dizziness since then. He had bleeding from his gums after starting Xarelto which was significant enough to stop anticoagulation. We elected to resume anticoagulation with a smaller dose of 15 mg once daily. Since then, he reports no bleeding from his gums. He does notice occasional blood in the stool but very rarely. Hemoglobin was checked with his primary care physician today and it was 12.3  Allergies  Allergen Reactions  . Xarelto [Rivaroxaban]     Bleeding gums     Current Outpatient Prescriptions on File Prior to Visit  Medication Sig Dispense Refill  . amLODipine (NORVASC) 10 MG tablet Take 10 mg by mouth daily.       [name] atenolol (TENORMIN) 50 MG tablet Take 25 mg by mouth daily.       [name] atorvastatin (LIPITOR) 80 MG tablet Take 80 mg by mouth daily.       [name] gemfibrozil (LOPID) 600 MG tablet Take 600 mg by mouth 2 (two) times daily.       .  hydrochlorothiazide (HYDRODIURIL) 25 MG tablet Take 25 mg by mouth daily.       [name] KRILL OIL OMEGA-3 PO Take by mouth 2 (two) times daily.      . potassium chloride (K-DUR) 10 MEQ tablet Take 20 mEq by mouth daily.      . Rivaroxaban (XARELTO) 15 MG TABS tablet Take 1 tablet (15 mg total) by mouth daily.  30 tablet  6   No current facility-administered medications on file prior to visit.     Past Medical History  Diagnosis Date  . Hyperlipidemia     Mixed  . GERD (gastroesophageal reflux disease)   . Osteoarthritis, knee   . Coronary artery disease     Artery bypass graft  . Atrial flutter     s/p ablation  . PSVT (paroxysmal supraventricular tachycardia)   . PAT (paroxysmal atrial tachycardia)   . Hypertension     Unspecified     Past Surgical History  Procedure Laterality Date  . Hemorrhoid surgery    . Coronary artery bypass graft  1993    CABG x 5 @ [place]  . Cardiac electrophysiology study and ablation    . Cardiac catheterization  1993  . Mohs surgery       Family History  Problem Relation Age of Onset  . Coronary artery disease Other   . Heart disease Father   . Heart attack Father 67    MI  History   Social History  . Marital Status: Married    Spouse Name: N/A    Number of Children: N/A  . Years of Education: N/A   Occupational History  . Retired    Social History Main Topics  . Smoking status: Never Smoker   . Smokeless tobacco: Not on file  . Alcohol Use: No  . Drug Use: No  . Sexual Activity: Not on file   Other Topics Concern  . Not on file   Social History Narrative   Gets regular exercise.      PHYSICAL EXAM   BP 120/70  Pulse 62  Ht 6\' 4"  (1.93 m)  Wt 201 lb 12 oz (91.513 kg)  BMI 24.57 kg/m2 Constitutional: He is oriented to person, place, and time. He appears well-developed and well-nourished. No distress.  HENT: No nasal discharge.  Head: Normocephalic and atraumatic.  Eyes: Pupils are equal and round. Right eye  exhibits no discharge. Left eye exhibits no discharge.  Neck: Normal range of motion. Neck supple. No JVD present. No thyromegaly present. No carotid bruits Cardiovascular: Bradycardic, irregular rhythm, normal heart sounds . Exam reveals no gallop and no friction rub. No murmur heard.  Pulmonary/Chest: Effort normal and breath sounds normal. No stridor. No respiratory distress. He has no wheezes. He has no rales. He exhibits no tenderness.  Abdominal: Soft. Bowel sounds are normal. He exhibits no distension. There is no tenderness. There is no rebound and no guarding.  Musculoskeletal: Normal range of motion. He exhibits no edema and no tenderness.  Neurological: He is alert and oriented to person, place, and time. Coordination normal.  Skin: Skin is warm and dry. No rash noted. He is not diaphoretic. No erythema. No pallor.  Psychiatric: He has a normal mood and affect. His behavior is normal. Judgment and thought content normal.     EKG: Atrial fibrillation  ABNORMAL RHYTHM    ASSESSMENT AND PLAN

## 2013-12-31 NOTE — Assessment & Plan Note (Signed)
He reports no symptoms of angina. Continue medical therapy. 

## 2013-12-31 NOTE — Assessment & Plan Note (Signed)
Continue with rate control. He is on smaller dose Xarelto due to gum bleeding and rare blood in stool.  He should continue to follow up with his PCP to see if any additional GI workup is needed

## 2013-12-31 NOTE — Patient Instructions (Signed)
Continue same medications.   Your physician wants you to follow-up in: 6 months.  You will receive a reminder letter in the mail two months in advance. If you don't receive a letter, please call our office to schedule the follow-up appointment.  

## 2013-12-31 NOTE — Assessment & Plan Note (Signed)
Continue treatment with atorvastatin with a target LDL of less than 70. This is managed by his primary care physician.

## 2014-06-06 ENCOUNTER — Inpatient Hospital Stay: Payer: Self-pay | Admitting: Internal Medicine

## 2014-07-02 ENCOUNTER — Encounter: Payer: Self-pay | Admitting: Cardiovascular Disease

## 2014-07-02 ENCOUNTER — Ambulatory Visit (INDEPENDENT_AMBULATORY_CARE_PROVIDER_SITE_OTHER): Payer: Medicare Other | Admitting: Cardiovascular Disease

## 2014-07-02 VITALS — BP 118/54 | HR 68 | Ht 76.0 in | Wt 208.3 lb

## 2014-07-02 DIAGNOSIS — I25810 Atherosclerosis of coronary artery bypass graft(s) without angina pectoris: Secondary | ICD-10-CM

## 2014-07-02 DIAGNOSIS — I10 Essential (primary) hypertension: Secondary | ICD-10-CM | POA: Diagnosis not present

## 2014-07-02 DIAGNOSIS — E785 Hyperlipidemia, unspecified: Secondary | ICD-10-CM

## 2014-07-02 DIAGNOSIS — I482 Chronic atrial fibrillation, unspecified: Secondary | ICD-10-CM

## 2014-07-02 NOTE — Patient Instructions (Signed)
Continue same medications.   Your physician wants you to follow-up in: 6 months.  You will receive a reminder letter in the mail two months in advance. If you don't receive a letter, please call our office to schedule the follow-up appointment.  

## 2014-07-02 NOTE — Progress Notes (Signed)
HPI  [name] is a pleasant 74 year old male who is here today for followup visit regarding coronary artery disease and chronic atrial fibrillation . He has a long history of coronary disease s/p bypass surgery in 1993. No ischemic cardiac events since then. He had atrial flutter in 2010  and underwent successful ablation by Dr. Lovena Le.   He had episodes of symptomatic bradycardia in 2014.  This improved after stopping Diltiazem . Echocardiogram in 11/2012  showed normal LV systolic function, mild left ventricular hypertrophy and mildly dilated left atrium. He was noted to be bradycardic during the echocardiogram with heart rates ranging from 45-52 beats per minute. Holter monitor showed sinus rhythm with evidence of paroxysmal atrial fibrillation with no atrial flutter. He did have sinus bradycardia and 2 pauses less than 3 seconds in duration during sleeping hours. Average heart rate was 58 beats per minute. He was started on Xarelto for anticoagulation. . He had bleeding from his gums after starting Xarelto which was significant enough to stop anticoagulation. We elected to resume anticoagulation with a smaller dose of 15 mg once daily. Since then, he reports no bleeding from his gums.  He was hospitalized recently for MRSA skin infection which was treated with antibiotics. He was seen by Dr. Ola Spurr and reports improvement. He is stable from a cardiac standpoint otherwise.   Allergies  Allergen Reactions  . Xarelto [Rivaroxaban]     Bleeding gums     Current Outpatient Prescriptions on File Prior to Visit  Medication Sig Dispense Refill  . amLODipine (NORVASC) 10 MG tablet Take 10 mg by mouth daily.     [name] atenolol (TENORMIN) 50 MG tablet Take 50 mg by mouth daily.     [name] atorvastatin (LIPITOR) 80 MG tablet Take 80 mg by mouth daily.     . hydrochlorothiazide (HYDRODIURIL) 25 MG tablet Take 25 mg by mouth daily.     [name] KRILL OIL OMEGA-3 PO Take by mouth 2 (two) times daily.    .  Melatonin 5 MG TABS Take by mouth as needed.    . potassium chloride (K-DUR) 10 MEQ tablet Take 20 mEq by mouth daily.    . Rivaroxaban (XARELTO) 15 MG TABS tablet Take 1 tablet (15 mg total) by mouth daily. 30 tablet 6   No current facility-administered medications on file prior to visit.     Past Medical History  Diagnosis Date  . Hyperlipidemia     Mixed  . GERD (gastroesophageal reflux disease)   . Osteoarthritis, knee   . Coronary artery disease     Artery bypass graft  . Atrial flutter     s/p ablation  . PSVT (paroxysmal supraventricular tachycardia)   . PAT (paroxysmal atrial tachycardia)   . Hypertension     Unspecified  . MRSA (methicillin resistant staph aureus) culture positive      Past Surgical History  Procedure Laterality Date  . Hemorrhoid surgery    . Coronary artery bypass graft  1993    CABG x 5 @ [place]  . Cardiac electrophysiology study and ablation    . Cardiac catheterization  1993  . Mohs surgery       Family History  Problem Relation Age of Onset  . Coronary artery disease Other   . Heart disease Father   . Heart attack Father 30    MI     History   Social History  . Marital Status: Married    Spouse Name: N/A  .  Number of Children: N/A  . Years of Education: N/A   Occupational History  . Retired    Social History Main Topics  . Smoking status: Never Smoker   . Smokeless tobacco: Not on file  . Alcohol Use: No  . Drug Use: No  . Sexual Activity: Not on file   Other Topics Concern  . Not on file   Social History Narrative   Gets regular exercise.      PHYSICAL EXAM   BP 118/54 mmHg  Pulse 68  Ht 6\' 4"  (1.93 m)  Wt 208 lb 5 oz (94.49 kg)  BMI 25.37 kg/m2 Constitutional: He is oriented to person, place, and time. He appears well-developed and well-nourished. No distress.  HENT: No nasal discharge.  Head: Normocephalic and atraumatic.  Eyes: Pupils are equal and round. Right eye exhibits no discharge. Left eye  exhibits no discharge.  Neck: Normal range of motion. Neck supple. No JVD present. No thyromegaly present. No carotid bruits Cardiovascular: Bradycardic, irregular rhythm, normal heart sounds . Exam reveals no gallop and no friction rub. No murmur heard.  Pulmonary/Chest: Effort normal and breath sounds normal. No stridor. No respiratory distress. He has no wheezes. He has no rales. He exhibits no tenderness.  Abdominal: Soft. Bowel sounds are normal. He exhibits no distension. There is no tenderness. There is no rebound and no guarding.  Musculoskeletal: Normal range of motion. He exhibits no edema and no tenderness.  Neurological: He is alert and oriented to person, place, and time. Coordination normal.  Skin: Skin is warm and dry. No rash noted. He is not diaphoretic. No erythema. No pallor.  Psychiatric: He has a normal mood and affect. His behavior is normal. Judgment and thought content normal.     EKG: Atrial fibrillation  -Prominent R(V1) and right axis -consider right ventricular hypertrophy  -consider pulmonary disease.   ABNORMAL     ASSESSMENT AND PLAN

## 2014-07-03 NOTE — Assessment & Plan Note (Signed)
Ventricular rate is well controlled on atenolol. He is tolerating anticoagulation with Xarelto.

## 2014-07-03 NOTE — Assessment & Plan Note (Signed)
He has no symptoms of angina. Continue medical therapy. 

## 2014-07-03 NOTE — Assessment & Plan Note (Signed)
Continue treatment with atorvastatin with a target LDL of less than 70. 

## 2014-07-03 NOTE — Assessment & Plan Note (Signed)
Blood pressure is well controlled on current medications. 

## 2014-08-02 NOTE — Consult Note (Signed)
PATIENT NAME:  Walter Mosley, Walter Mosley MR#:  [id number] DATE OF BIRTH:  1940/12/13  DATE OF CONSULTATION:  02/25/2013  REFERRING PHYSICIAN:   CONSULTING PHYSICIAN:  Janalee Dane, MD  HISTORY OF PRESENT ILLNESS: The patient is a 74 year old very pleasant gentleman which has had been bleeding "on and off" from his gums since starting Xarelto but had bleeding that would not stop from around 2 of his mandibular molars this morning. This was not preceded by any pain or sign of infection but he has had previous root canals and crowns in those teeth. He does not recall biting down on anything that would have caused a fracture in the tooth. He took his last Xarelto last night.   MEDICATIONS: Xarelto.   PAST MEDICAL HISTORY: Atrial fibrillation, status post cardiac ablation by Dr. Lovena Le in [place].   PHYSICAL EXAMINATION: GENERAL: Pleasant well-nourished, well-developed, age-appropriate male in no acute distress.  NOSE: Septum is midline. No bleeding from the nasal mucosa.  ORAL CAVITY AND OROPHARYNX: There is active bleeding from around the right mandibular molar and third and second molar. No bleeding from the surrounding soft tissue. No obvious fractures in the teeth but there are crowns and the bleeding is coming from underneath the crowns, both on the lingual and the buccal sides of the peridental gingiva. No other masses or lesions other than a small torus mandibularis.   IMPRESSION: Active bleeding, dental in the origin. Will likely require oral surgery which we do not have at this hospital. I have discussed the case with the Emergency Room physician and he will arrange for transfer to a facility that has oral surgery to manage the case.    ____________________________ J. Nadeen Landau, MD jmc:cs D: 02/25/2013 12:57:47 ET T: 02/25/2013 14:54:30 ET JOB#: [id number]  cc: Janalee Dane, MD, <Dictator> [place] MD ELECTRONICALLY SIGNED 03/01/2013  10:44

## 2014-08-11 NOTE — H&P (Signed)
PATIENT NAME:  Walter Mosley, Walter Mosley MR#:  [id number] DATE OF BIRTH:  [birth year]  DATE OF ADMISSION:  06/06/2014  PRIMARY CARE PHYSICIAN:  Dr. Elenore Rota.    REFERRING PHYSICIAN:  Dr. Clearnce Hasten.   CHIEF COMPLAINT: Right arm pain, swelling, and warmness for the past 5 days.   HISTORY OF PRESENT ILLNESS: A 74 year old Caucasian male with a history of hypertension, CAD, presented to the ED with the above chief complaint. The patient is alert, awake, oriented, in no acute distress. The patient said he found a small abscess on the left upper arm 5 days ago. He was treated with antibiotics, possible Bactrim, by PCP, however the abscess became larger and also left arm became warm, red, and swelling, so the patient came to the ED for further evaluation and treatment. The patient was found to have abscess on the left arm with cellulitis, the patient got incision and drainage in ED.    PAST MEDICAL HISTORY: Hypertension, atrial fibrillation on Xarelto, CAD.    PAST SURGICAL HISTORY:  No.    SOCIAL HISTORY: No smoking or drinking or illicit drugs.   FAMILY HISTORY: Father had heart attack. Mother had a stroke.    ALLERGIES:  No.    HOME MEDICATIONS: Medication reconciliation is not done yet, but the patient is taking Xarelto 20 mg p.o. daily, potassium 20 mEq daily, HCTZ 25 mg p.o. daily, gemfibrozil 600 mg p.o. b.i.d., enalapril 20 mg p.o. b.i.d., Norvasc 10 mg p.o. daily.   REVIEW OF SYSTEMS:    CONSTITUTIONAL: The patient denies any fever or chills. No headache or dizziness or weakness.  EYES: No double vision or blurred vision.  EARS, NOSE, AND THROAT: No postnasal drip, slurred speech, or dysphagia. CARDIOVASCULAR: No chest pain, palpitation, orthopnea, or nocturnal dyspnea. No leg edema.  PULMONARY: No cough, sputum, shortness of breath, or hematemesis.  GASTROINTESTINAL: No abdominal pain, nausea, vomiting, diarrhea. No melena or bloody stool.  GENITOURINARY: No dysuria, hematuria, or incontinence.   SKIN: No rash or jaundice.  NEUROLOGY: No syncope, loss of consciousness, or seizure.  ENDOCRINOLOGY: No polyuria, polydipsia, heat or cold intolerance.  HEMATOLOGY: No easy bleeding or bleeding.  MUSCULOSKELETAL: Left arm pain, swelling, and warmness, and redness.   PHYSICAL EXAMINATION:  VITAL SIGNS: Temperature 98.2, blood pressure129/70, pulse 84, O2 saturation 95% on room air.  GENERAL: The patient is alert, awake, oriented, in no acute distress.  HEENT: Pupils round, equal, reactive to light and accommodation. Moist oral mucosa. Clear oropharynx.  NECK: Supple. No JVD or carotid bruit. No lymphadenopathy. No thyromegaly.  CARDIOVASCULAR: S1, S2, regular rate and rhythm. No murmurs, gallops.   PULMONARY: Bilateral air entry. No wheezing or rales. No use of accessory muscle to breathe.  ABDOMEN: Soft. No distention or tenderness. No organomegaly. Bowel sounds present.  EXTREMITIES: No edema, clubbing, or cyanosis. No calf tenderness. Bilateral pedal pulses present. Left arm in dressing with swelling, erythema from surgical site to left elbow.  SKIN: No rash or jaundice.  NEUROLOGIC: A and O x 3. No focal deficit. Power 5/5. Sensory intact.   LABORATORY DATA:  Glucose 101, BUN 19, creatinine 1.2. Electrolytes normal. WBC 8.0, hemoglobin 11.9, platelets 150,000.   IMPRESSIONS:  1.  Left arm abscess and cellulitis.  2.  Hypertension.  3.  Atrial fibrillation on Xarelto, rate controlled.  4.  Coronary artery disease.    PLAN OF TREATMENT:  1.  The patient will be admitted to medical floor and we will start vancomycin and Unasyn for left arm abscess  and cellulitis. Follow up CBC.  2.  For atrial fibrillation, continue Xarelto.  3.  For hypertension, continue the patient's hypertension medication.  4.  I discussed the patient's condition and plan of treatment with the patient and the patient's grandson.  The patient wants full code.   TIME SPENT: About 48 minutes.       ____________________________ Demetrios Loll, MD qc:bu D: 06/06/2014 18:31:09 ET T: 06/06/2014 21:03:27 ET JOB#: [id number]  cc: Demetrios Loll, MD, <Dictator> Demetrios Loll MD ELECTRONICALLY SIGNED 06/08/2014 19:59

## 2014-08-11 NOTE — Consult Note (Signed)
Brief Consult Note: Diagnosis: L triceps area MRSA cellulitis.   Patient was seen by consultant.   Comments: I&D in ED Thursday evening. Hand swelling markedly improved, acc. to pt. Drainage adequate, packing removed, cellulitis significant and chronic (woody). No need for any further surgical care. Will sign off.  Electronic Signatures: Consuela Mimes (MD)  (Signed [phone] 12:37)  Authored: Brief Consult Note   Last Updated: 28-Feb-16 12:37 by Consuela Mimes (MD)

## 2014-08-11 NOTE — Discharge Summary (Signed)
PATIENT NAME:  Walter Mosley, Walter Mosley MR#:  [id number] DATE OF BIRTH:  [birth year]  DATE OF ADMISSION:  06/06/2014 DATE OF DISCHARGE:  06/10/2014  DISCHARGE DIAGNOSES: Left triceps/elbow abscess with cellulitis with methicillin-resistant Staphylococcus aureus.   PROCEDURES: Incision and drainage of the abscess done in the ED on 06/06/2014.   CONSULTATIONS: Surgery.  BRIEF HISTORY AND PHYSICAL AND HOSPITAL COURSE BASED ON THE PROBLEMS:  1. Please review history and physical for details. The patient is a 74 year old Caucasian male who came into the ED with a chief complaint of  arm pain, swelling and warmness for 5 days. The patient was treated with p.o. Bactrim by PCP for approximately 4 to 5 days with no significant improvement. He came into the ED and the abscesses was becoming large and warm. He had an incision and drainage done in the ED by the ED physician. The patient was admitted to the hospital for left elbow abscess and cellulitis. He was started on vancomycin and Unasyn. Subsequently, the wound cultures have grown Staphylococcus aureus. Unasyn was discontinued and vancomycin was continued. He was diagnosed with MRSA. As the patient was complaining of worsening of the swelling, left upper extremity venous Dopplers were done and DVT was ruled out and surgical consult was placed. The patient was seen by surgery, as the abscessed area was indurated, no other interventions. Vancomycin was continued and the patient started feeling better. All care was continued and decision was made to discharge the patient home with p.o. Bactrim as the patient had MRSA. 2. For the atrial fibrillation, his home medication of Xarelto was continued.  3. For hypertension, his home medications were continued. 4. Coronary artery disease, stable. No chest pain or shortness of breath noted during the hospital course.   The patient was discharged home with Bactrim in stable condition.   LABORATORIES AND IMAGING STUDIES: The  patient's creatinine 1.14 on February 27, GFR greater than 60.    Wound culture was positive for MRSA.   White count 7.8, hemoglobin 11.9, hematocrit 35.6, platelets are 144,000.   Blood culture has revealed no growth.   Wound culture: Heavy growth of MRSA sensitive to Bactrim, clindamycin. Resistant to oxacillin and ciprofloxacin.   CONDITION AT THE TIME OF DISCHARGE: Stable.   MEDICATIONS AT THE TIME OF DISCHARGE: Amlodipine 10 mg p.o. once daily at a.m., Xarelto 15 mg p.o. once daily for atrial fibrillation, potassium chloride 20 mEq 2 tablets p.o. once daily, hydrochlorothiazide 20 mg p.o. once daily in the a.m., atenolol 25 mg p.o. once daily in the a.m., Tylenol 325 mg 2 tablets p.o. every 4 hours as needed for mild pain, acetaminophen/oxycodone 325/5 one tablet p.o. every 6 hours as needed for moderate to severe pain, Bactrim DS 1 tablet p.o. 2 times a day.   INSTRUCTIONS: Dressing care needs to be continued, instructed by nursing.   DIET: Low sodium.   ACTIVITY: As tolerated.   FOLLOW-UP: With primary care physician in a week.  MRSA education was provided and hand washing was increased.   The diagnosis and plan of care was discussed with the patient and his questions were answered. He verbalized understanding of the plan. The patient was discharged home in stable condition.   TOTAL TIME SPENT ON THE DISCHARGE AND COORDINATION OF CARE:  45 minutes.    ____________________________ Nicholes Mango, MD ag:JT D: 06/13/2014 22:39:00 ET T: 06/14/2014 10:29:54 ET JOB#: [id number]  cc: Nicholes Mango, MD, <Dictator> Nicholes Mango MD ELECTRONICALLY SIGNED 06/25/2014 20:44

## 2014-12-13 ENCOUNTER — Emergency Department
Admission: EM | Admit: 2014-12-13 | Discharge: 2014-12-13 | Disposition: A | Discharge status: Home / Self Care | Payer: Medicare Other | Attending: Emergency Medicine | Admitting: Emergency Medicine

## 2014-12-13 ENCOUNTER — Encounter: Payer: Self-pay | Admitting: Emergency Medicine

## 2014-12-13 DIAGNOSIS — I10 Essential (primary) hypertension: Secondary | ICD-10-CM | POA: Insufficient documentation

## 2014-12-13 DIAGNOSIS — R040 Epistaxis: Secondary | ICD-10-CM | POA: Diagnosis not present

## 2014-12-13 MED ORDER — OXYMETAZOLINE HCL 0.05 % NA SOLN
2.0000 | Freq: Once | NASAL | Status: AC
  Administered 2014-12-13: 2 via NASAL
  Filled 2014-12-13: qty 15

## 2014-12-13 NOTE — ED Notes (Signed)
Pt to rm 18 via EMS from home.  Pt reports nosebleed from right nare starting about an hour ago.  Pt reports sore inside nose which he was picking and started bleeding.  EMS report profuse bleeding initially, pt given 2 sprays afrin and bleeding slowed.  No current bleeding at this time.  Pt NAD upon arrival.

## 2014-12-13 NOTE — ED Provider Notes (Signed)
[facility] Emergency Department Provider Note     Time seen: ----------------------------------------- 10:14 PM on 12/13/2014 -----------------------------------------    I have reviewed the triage vital signs and the nursing notes.   HISTORY  Chief Complaint Epistaxis    HPI Walter Mosley. is a 74 y.o. male who presents ER being brought from home for a nosebleed. Patient states he had bleeding from the right naris started about an hour ago. Patient states he was picking at something in the inside of the right nostril and started bleeding profusely. After EMS arrival and some Afrin and began to slow down considerably. Patient does takes Xarelto, has had gum bleeding in the past but no previous nosebleeds.   Past Medical History  Diagnosis Date  . Hyperlipidemia     Mixed  . GERD (gastroesophageal reflux disease)   . Osteoarthritis, knee   . Coronary artery disease     Artery bypass graft  . Atrial flutter     s/p ablation  . PSVT (paroxysmal supraventricular tachycardia)   . PAT (paroxysmal atrial tachycardia)   . Hypertension     Unspecified  . MRSA (methicillin resistant staph aureus) culture positive     Patient Active Problem List   Diagnosis Date Noted  . Chronic atrial fibrillation 12/31/2013  . Pre-syncope 11/16/2012  . Coronary artery disease   . Hyperlipidemia 01/29/2009  . Essential hypertension 01/29/2009  . CAD, ARTERY BYPASS GRAFT 01/29/2009  . SVT/ PSVT/ PAT 01/29/2009  . ATRIAL FLUTTER 01/29/2009    Past Surgical History  Procedure Laterality Date  . Hemorrhoid surgery    . Coronary artery bypass graft  1993    CABG x 5 @ [place]  . Cardiac electrophysiology study and ablation    . Cardiac catheterization  1993  . Mohs surgery      Allergies Review of patient's allergies indicates no active allergies.  Social History Social History  Substance Use Topics  . Smoking status: Never Smoker   . Smokeless tobacco: Not  on file  . Alcohol Use: No    Review of Systems Constitutional: Negative for fever. Eyes: Negative for visual changes. ENT: Positive for epistaxis   ___________________________________________   PHYSICAL EXAM:  VITAL SIGNS: ED Triage Vitals  Enc Vitals Group     BP 12/13/14 2212 163/96 mmHg     Pulse Rate 12/13/14 2212 78     Resp 12/13/14 2212 16     Temp 12/13/14 2212 98.7 F (37.1 C)     Temp Source 12/13/14 2212 Oral     SpO2 12/13/14 2212 98 %     Weight 12/13/14 2212 207 lb 10.8 oz (94.201 kg)     Height 12/13/14 2212 6\' 4"  (1.93 m)     Head Cir --      Peak Flow --      Pain Score 12/13/14 2213 0     Pain Loc --      Pain Edu? --      Excl. in [place]? --     Constitutional: Alert and oriented. Well appearing and in no distress. Eyes: Conjunctivae are normal. PERRL. Normal extraocular movements. ENT   Head: Normocephalic and atraumatic.   Nose: Recent bleeding from the right anterior nasal septum. No active bleeding at this time.   Mouth/Throat: Mucous membranes are moist.   Neck: No stridor. Skin:  Skin is warm, dry and intact. No rash noted. Psychiatric: Mood and affect are normal. Speech and behavior are normal. Patient exhibits  appropriate insight and judgment.  ____________________________________________  ED COURSE:  Pertinent labs & imaging results that were available during my care of the patient were reviewed by me and considered in my medical decision making (see chart for details). I carefully examine the right nasal septum with nasal speculum as well as suction. We reapplied Afrin nasal spray. His been no further bleeding at this time. ____________________________________________     FINAL ASSESSMENT AND PLAN  Epistaxis  Plan: Patient with labs and imaging as dictated above. Patient has been observed in the ER without any further bleeding. He is encouraged to swab the inside of the narrow with antibiotic ointment. Is advised not to  blow or pick at the nose. He can continue Afrin as needed.   Earleen Newport, MD   Earleen Newport, MD 12/13/14 [phone]

## 2014-12-13 NOTE — Discharge Instructions (Signed)

## 2014-12-30 ENCOUNTER — Ambulatory Visit: Payer: Medicare Other | Admitting: Cardiovascular Disease

## 2014-12-31 ENCOUNTER — Ambulatory Visit: Payer: Medicare Other | Admitting: Cardiovascular Disease

## 2015-01-16 ENCOUNTER — Encounter: Payer: Self-pay | Admitting: Cardiovascular Disease

## 2015-01-16 ENCOUNTER — Ambulatory Visit (INDEPENDENT_AMBULATORY_CARE_PROVIDER_SITE_OTHER): Payer: Medicare Other | Admitting: Cardiovascular Disease

## 2015-01-16 VITALS — BP 134/62 | HR 65 | Ht 76.0 in | Wt 211.8 lb

## 2015-01-16 DIAGNOSIS — I10 Essential (primary) hypertension: Secondary | ICD-10-CM

## 2015-01-16 DIAGNOSIS — E785 Hyperlipidemia, unspecified: Secondary | ICD-10-CM

## 2015-01-16 DIAGNOSIS — I2510 Atherosclerotic heart disease of native coronary artery without angina pectoris: Secondary | ICD-10-CM | POA: Diagnosis not present

## 2015-01-16 DIAGNOSIS — I482 Chronic atrial fibrillation, unspecified: Secondary | ICD-10-CM

## 2015-01-16 DIAGNOSIS — Z23 Encounter for immunization: Secondary | ICD-10-CM

## 2015-01-16 MED ORDER — RIVAROXABAN 20 MG PO TABS: 20.0000 mg | ORAL_TABLET | Freq: Every day | ORAL | Status: DC

## 2015-01-16 NOTE — Progress Notes (Signed)
HPI  [name] is a pleasant 74 year old male who is here today for followup visit regarding coronary artery disease and chronic atrial fibrillation . He has a long history of coronary disease s/p bypass surgery in 1993. No ischemic cardiac events since then. He had atrial flutter in 2010  and underwent successful ablation by Dr. Lovena Le.   He had episodes of symptomatic bradycardia in 2014.  This improved after stopping Diltiazem . Echocardiogram in 11/2012  showed normal LV systolic function, mild left ventricular hypertrophy and mildly dilated left atrium. He was noted to be bradycardic during the echocardiogram with heart rates ranging from 45-52 beats per minute. Holter monitor showed sinus rhythm with evidence of paroxysmal atrial fibrillation with no atrial flutter. He did have sinus bradycardia and 2 pauses less than 3 seconds in duration during sleeping hours. Average heart rate was 58 beats per minute. He was started on Xarelto for anticoagulation. .  He had bleeding from his gums after starting Xarelto which was significant enough to stop anticoagulation. We elected to resume anticoagulation with a smaller dose of 15 mg once daily. Since then, he reports no bleeding from his gums.  He had recent nosebleed that required an emergency room visit. He underwent cauterization with no further bleeding.   No Active Allergies   Current Outpatient Prescriptions on File Prior to Visit  Medication Sig Dispense Refill  . amLODipine (NORVASC) 10 MG tablet Take 10 mg by mouth daily.     [name] atenolol (TENORMIN) 50 MG tablet Take 50 mg by mouth daily.     [name] atorvastatin (LIPITOR) 80 MG tablet Take 80 mg by mouth daily.     . hydrochlorothiazide (HYDRODIURIL) 25 MG tablet Take 25 mg by mouth daily.     [name] KRILL OIL OMEGA-3 PO Take by mouth 2 (two) times daily.    . Melatonin 5 MG TABS Take by mouth as needed.    . potassium chloride (K-DUR) 10 MEQ tablet Take 20 mEq by mouth daily.    . Rivaroxaban  (XARELTO) 15 MG TABS tablet Take 1 tablet (15 mg total) by mouth daily. 30 tablet 6   No current facility-administered medications on file prior to visit.     Past Medical History  Diagnosis Date  . Hyperlipidemia     Mixed  . GERD (gastroesophageal reflux disease)   . Osteoarthritis, knee   . Coronary artery disease     Artery bypass graft  . Atrial flutter ([place])     s/p ablation  . PSVT (paroxysmal supraventricular tachycardia) ([place])   . PAT (paroxysmal atrial tachycardia) ([place])   . Hypertension     Unspecified  . MRSA (methicillin resistant staph aureus) culture positive      Past Surgical History  Procedure Laterality Date  . Hemorrhoid surgery    . Coronary artery bypass graft  1993    CABG x 5 @ [place]  . Cardiac electrophysiology study and ablation    . Cardiac catheterization  1993  . Mohs surgery       Family History  Problem Relation Age of Onset  . Coronary artery disease Other   . Heart disease Father   . Heart attack Father 76    MI     Social History   Social History  . Marital Status: Married    Spouse Name: N/A  . Number of Children: N/A  . Years of Education: N/A   Occupational History  . Retired    Science writer  History Main Topics  . Smoking status: Never Smoker   . Smokeless tobacco: Not on file  . Alcohol Use: No  . Drug Use: No  . Sexual Activity: Not on file   Other Topics Concern  . Not on file   Social History Narrative   Gets regular exercise.      PHYSICAL EXAM   BP 134/62 mmHg  Pulse 65  Ht 6\' 4"  (1.93 m)  Wt 211 lb 12 oz (96.049 kg)  BMI 25.79 kg/m2 Constitutional: He is oriented to person, place, and time. He appears well-developed and well-nourished. No distress.  HENT: No nasal discharge.  Head: Normocephalic and atraumatic.  Eyes: Pupils are equal and round. Right eye exhibits no discharge. Left eye exhibits no discharge.  Neck: Normal range of motion. Neck supple. No JVD present. No thyromegaly present. No  carotid bruits Cardiovascular: Normal rate, irregular rhythm, normal heart sounds . Exam reveals no gallop and no friction rub. No murmur heard.  Pulmonary/Chest: Effort normal and breath sounds normal. No stridor. No respiratory distress. He has no wheezes. He has no rales. He exhibits no tenderness.  Abdominal: Soft. Bowel sounds are normal. He exhibits no distension. There is no tenderness. There is no rebound and no guarding.  Musculoskeletal: Normal range of motion. He exhibits no edema and no tenderness.  Neurological: He is alert and oriented to person, place, and time. Coordination normal.  Skin: Skin is warm and dry. No rash noted. He is not diaphoretic. No erythema. No pallor.  Psychiatric: He has a normal mood and affect. His behavior is normal. Judgment and thought content normal.     EKG: Atrial fibrillation    ABNORMAL     ASSESSMENT AND PLAN

## 2015-01-16 NOTE — Patient Instructions (Signed)
Medication Instructions:  Your physician has recommended you make the following change in your medication:  INCREASE xarelto to 20mg  once per day   Labwork: Liver, lipid, CBC, BMET  Testing/Procedures: none  Follow-Up: Your physician wants you to follow-up in: six months with Dr. Fletcher Anon.  You will receive a reminder letter in the mail two months in advance. If you don't receive a letter, please call our office to schedule the follow-up appointment.   Any Other Special Instructions Will Be Listed Below (If Applicable).

## 2015-01-17 ENCOUNTER — Other Ambulatory Visit: Payer: Self-pay

## 2015-01-17 DIAGNOSIS — E785 Hyperlipidemia, unspecified: Secondary | ICD-10-CM

## 2015-01-17 LAB — BASIC METABOLIC PANEL
BUN / CREAT RATIO: 10 (ref 10–22)
BUN: 10 mg/dL (ref 8–27)
CALCIUM: 8.9 mg/dL (ref 8.6–10.2)
CO2: 24 mmol/L (ref 18–29)
Chloride: 98 mmol/L (ref 97–108)
Creatinine, Ser: 1.01 mg/dL (ref 0.76–1.27)
GFR, EST AFRICAN AMERICAN: 84 mL/min/{1.73_m2} (ref >59)
GFR, EST NON AFRICAN AMERICAN: 72 mL/min/{1.73_m2} (ref >59)
Glucose: 113 mg/dL — ABNORMAL HIGH (ref 65–99)
POTASSIUM: 3.9 mmol/L (ref 3.5–5.2)
Sodium: 139 mmol/L (ref 134–144)

## 2015-01-17 LAB — LIPID PANEL
CHOLESTEROL TOTAL: 169 mg/dL (ref 100–199)
Chol/HDL Ratio: 5.6 ratio units — ABNORMAL HIGH (ref 0.0–5.0)
HDL: 30 mg/dL — ABNORMAL LOW (ref >39)
LDL Calculated: 103 mg/dL — ABNORMAL HIGH (ref 0–99)
Triglycerides: 182 mg/dL — ABNORMAL HIGH (ref 0–149)
VLDL Cholesterol Cal: 36 mg/dL (ref 5–40)

## 2015-01-17 LAB — CBC
HEMATOCRIT: 39.2 % (ref 37.5–51.0)
Hemoglobin: 13.4 g/dL (ref 12.6–17.7)
MCH: 28.9 pg (ref 26.6–33.0)
MCHC: 34.2 g/dL (ref 31.5–35.7)
MCV: 85 fL (ref 79–97)
PLATELETS: 182 10*3/uL (ref 150–379)
RBC: 4.64 x10E6/uL (ref 4.14–5.80)
RDW: 14.4 % (ref 12.3–15.4)
WBC: 5.7 10*3/uL (ref 3.4–10.8)

## 2015-01-17 LAB — HEPATIC FUNCTION PANEL
ALT: 25 IU/L (ref 0–44)
AST: 24 IU/L (ref 0–40)
Albumin: 4.7 g/dL (ref 3.5–4.8)
Alkaline Phosphatase: 60 IU/L (ref 39–117)
BILIRUBIN TOTAL: 0.7 mg/dL (ref 0.0–1.2)
BILIRUBIN, DIRECT: 0.15 mg/dL (ref 0.00–0.40)
Total Protein: 7.2 g/dL (ref 6.0–8.5)

## 2015-01-17 MED ORDER — EZETIMIBE 10 MG PO TABS: 10.0000 mg | ORAL_TABLET | Freq: Every day | ORAL | Status: DC

## 2015-01-17 NOTE — Assessment & Plan Note (Signed)
He has no symptoms of angina. Continue medical therapy. 

## 2015-01-17 NOTE — Assessment & Plan Note (Signed)
Blood pressure is controlled on current medications. 

## 2015-01-17 NOTE — Assessment & Plan Note (Signed)
He is doing well with rate control with no significant symptoms. I elected to increase the dose of Xarelto back to 20 mg once daily given that this is the appropriate dose for him.

## 2015-02-08 ENCOUNTER — Ambulatory Visit
Admission: EM | Admit: 2015-02-08 | Discharge: 2015-02-08 | Disposition: A | Discharge status: Home / Self Care | Payer: Medicare Other | Attending: Family Medicine | Admitting: Family Medicine

## 2015-02-08 ENCOUNTER — Encounter: Payer: Self-pay | Admitting: Gynecology

## 2015-02-08 DIAGNOSIS — L02415 Cutaneous abscess of right lower limb: Secondary | ICD-10-CM | POA: Diagnosis not present

## 2015-02-08 DIAGNOSIS — L03115 Cellulitis of right lower limb: Secondary | ICD-10-CM

## 2015-02-08 HISTORY — DX: Malignant (primary) neoplasm, unspecified: C80.1

## 2015-02-08 MED ORDER — MUPIROCIN 2 % EX OINT: 1.0000 "application " | TOPICAL_OINTMENT | Freq: Three times a day (TID) | CUTANEOUS | Status: DC

## 2015-02-08 MED ORDER — SULFAMETHOXAZOLE-TRIMETHOPRIM 800-160 MG PO TABS: 1.0000 | ORAL_TABLET | Freq: Two times a day (BID) | ORAL | Status: DC

## 2015-02-08 NOTE — ED Provider Notes (Signed)
CSN: [id number]     Arrival date & time 02/08/15  1550 History   First MD Initiated Contact with Patient 02/08/15 1723    Nurses notes were reviewed. Chief Complaint  Patient presents with  . Wound Infection     Patient has a healing cancer biopsy that occurred about 6 weeks ago. Medial to that he start having an abscess that he opened himsdelf  with draining.   (Consider location/radiation/quality/duration/timing/severity/associated sxs/prior Treatment) Patient is a 74 y.o. male presenting with abscess. The history is provided by the patient. No language interpreter was used.  Abscess Location:  Leg Leg abscess location:  R lower leg Abscess quality: draining, induration, painful, redness and warmth   Abscess quality: no fluctuance and not weeping   Duration:  5 days Progression:  Worsening Pain details:    Quality:  Sharp   Progression:  Worsening Chronicity:  New Context: skin injury   Relieved by:  Nothing Ineffective treatments:  Draining/squeezing Associated symptoms: no anorexia, no fatigue, no fever, no nausea and no vomiting   Risk factors: no family hx of MRSA     Past Medical History  Diagnosis Date  . Hyperlipidemia     Mixed  . GERD (gastroesophageal reflux disease)   . Osteoarthritis, knee   . Coronary artery disease     Artery bypass graft  . Atrial flutter ([place])     s/p ablation  . PSVT (paroxysmal supraventricular tachycardia) ([place])   . PAT (paroxysmal atrial tachycardia) ([place])   . Hypertension     Unspecified  . 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 Cancer ([place])     squamous cell carcimnoma right leg   Past Surgical History  Procedure Laterality Date  . Hemorrhoid surgery    . Coronary artery bypass graft  1993    CABG x 5 @ [place]  . Cardiac electrophysiology study and ablation    . Cardiac catheterization  1993  . Mohs surgery     Family History  Problem Relation Age of Onset  . Coronary artery disease Other   .  Heart disease Father   . Heart attack Father 52    MI   Social History  Substance Use Topics  . Smoking status: Never Smoker   . Smokeless tobacco: None  . Alcohol Use: No    Review of Systems  Constitutional: Negative for fever and fatigue.  Gastrointestinal: Negative for nausea, vomiting and anorexia.    Allergies  Review of patient's allergies indicates no known allergies.  Home Medications   Prior to Admission medications   Medication Sig Start Date End Date Taking? Authorizing Provider  amLODipine (NORVASC) 10 MG tablet Take 10 mg by mouth daily.  11/09/12   Historical Provider, MD  atenolol (TENORMIN) 50 MG tablet Take 50 mg by mouth daily.     Historical Provider, MD  atorvastatin (LIPITOR) 80 MG tablet Take 80 mg by mouth daily.  10/03/12   Historical Provider, MD  ezetimibe (ZETIA) 10 MG tablet Take 1 tablet (10 mg total) by mouth daily. 01/17/15   Wellington Hampshire, MD  hydrochlorothiazide (HYDRODIURIL) 25 MG tablet Take 25 mg by mouth daily.  11/09/12   Historical Provider, MD  KRILL OIL OMEGA-3 PO Take by mouth 2 (two) times daily.    Historical Provider, MD  Melatonin 5 MG TABS Take by mouth as needed.    Historical Provider, MD  mupirocin ointment (BACTROBAN) 2 % Apply 1 application topically 3 (three) times daily. 02/08/15  Frederich Cha, MD  potassium chloride (K-DUR) 10 MEQ tablet Take 20 mEq by mouth daily.    Historical Provider, MD  rivaroxaban (XARELTO) 20 MG TABS tablet Take 1 tablet (20 mg total) by mouth daily with supper. 01/16/15   Wellington Hampshire, MD  sulfamethoxazole-trimethoprim (BACTRIM DS,SEPTRA DS) 800-160 MG tablet Take 1 tablet by mouth 2 (two) times daily. 02/08/15   Frederich Cha, MD   Meds Ordered and Administered this Visit  Medications - No data to display  BP 143/62 mmHg  Pulse 76  Temp(Src) 98.2 F (36.8 C) (Oral)  Resp 16  Ht 6\' 4"  (1.93 m)  Wt 205 lb (92.987 kg)  BMI 24.96 kg/m2  SpO2 98% No data found.   Physical Exam    Constitutional: He is oriented to person, place, and time. He appears well-developed and well-nourished.  HENT:  Head: Normocephalic and atraumatic.  Eyes: Conjunctivae are normal. Pupils are equal, round, and reactive to light.  Musculoskeletal: Normal range of motion. He exhibits tenderness.       Legs: Lateral to the abscess is the biopsy site we had skin cancer removed a few weeks ago. The abscess is obvious with ulceration where he has been draining after he punctured it.  Neurological: He is alert and oriented to person, place, and time. No cranial nerve deficit.  Skin: No rash noted. There is erythema.  Psychiatric: He has a normal mood and affect. His behavior is normal.  Vitals reviewed.   ED Course  Procedures (including critical care time)  Labs Review Labs Reviewed - No data to display  Imaging Review No results found.   Visual Acuity Review  Right Eye Distance:   Left Eye Distance:   Bilateral Distance:    Right Eye Near:   Left Eye Near:    Bilateral Near:         MDM   1. Cellulitis of right lower extremity   2. Abscess of right lower leg     We'll place him on Septra DS and Bactrim and ointment to 3 times a day.  Frederich Cha, MD 02/08/15 [phone]

## 2015-02-08 NOTE — ED Notes (Signed)
Pt c/o swelling and redness to the right mid calf since last Sunday night and has had some drainage from the site.

## 2015-02-08 NOTE — Discharge Instructions (Signed)
Abscess An abscess (boil or furuncle) is an infected area on or under the skin. This area is filled with yellowish-white fluid (pus) and other material (debris). HOME CARE   Only take medicines as told by your doctor.  If you were given antibiotic medicine, take it as directed. Finish the medicine even if you start to feel better.  If gauze is used, follow your doctor's directions for changing the gauze.  To avoid spreading the infection:  Keep your abscess covered with a bandage.  Wash your hands well.  Do not share personal care items, towels, or whirlpools with others.  Avoid skin contact with others.  Keep your skin and clothes clean around the abscess.  Keep all doctor visits as told. GET HELP RIGHT AWAY IF:   You have more pain, puffiness (swelling), or redness in the wound site.  You have more fluid or blood coming from the wound site.  You have muscle aches, chills, or you feel sick.  You have a fever. MAKE SURE YOU:   Understand these instructions.  Will watch your condition.  Will get help right away if you are not doing well or get worse.   This information is not intended to replace advice given to you by your health care provider. Make sure you discuss any questions you have with your health care provider.   Document Released: 09/15/2007 Document Revised: 09/28/2011 Document Reviewed: 06/12/2011 Elsevier Interactive Patient Education 2016 Else[employer]  Cellulitis Cellulitis is an infection of the skin and the tissue under the skin. The infected area is usually red and tender. This happens most often in the arms and lower legs. HOME CARE   Take your antibiotic medicine as told. Finish the medicine even if you start to feel better.  Keep the infected arm or leg raised (elevated).  Put a warm cloth on the area up to 4 times per day.  Only take medicines as told by your doctor.  Keep all doctor visits as told. GET HELP IF:  You see red streaks on  the skin coming from the infected area.  Your red area gets bigger or turns a dark color.  Your bone or joint under the infected area is painful after the skin heals.  Your infection comes back in the same area or different area.  You have a puffy (swollen) bump in the infected area.  You have new symptoms.  You have a fever. GET HELP RIGHT AWAY IF:   You feel very sleepy.  You throw up (vomit) or have watery poop (diarrhea).  You feel sick and have muscle aches and pains.   This information is not intended to replace advice given to you by your health care provider. Make sure you discuss any questions you have with your health care provider.   Document Released: 09/15/2007 Document Revised: 12/18/2014 Document Reviewed: 06/14/2011 Elsevier Interactive Patient Education [employer].

## 2015-02-11 ENCOUNTER — Inpatient Hospital Stay
Admission: EM | Admit: 2015-02-11 | Discharge: 2015-02-15 | DRG: 603 | Disposition: A | Discharge status: Home / Self Care | Payer: Medicare Other | Attending: Internal Medicine | Admitting: Internal Medicine

## 2015-02-11 ENCOUNTER — Encounter: Payer: Self-pay | Admitting: Emergency Medicine

## 2015-02-11 DIAGNOSIS — Z7902 Long term (current) use of antithrombotics/antiplatelets: Secondary | ICD-10-CM | POA: Diagnosis not present

## 2015-02-11 DIAGNOSIS — Z8249 Family history of ischemic heart disease and other diseases of the circulatory system: Secondary | ICD-10-CM

## 2015-02-11 DIAGNOSIS — I480 Paroxysmal atrial fibrillation: Secondary | ICD-10-CM | POA: Diagnosis present

## 2015-02-11 DIAGNOSIS — L0390 Cellulitis, unspecified: Secondary | ICD-10-CM

## 2015-02-11 DIAGNOSIS — L03115 Cellulitis of right lower limb: Secondary | ICD-10-CM | POA: Diagnosis present

## 2015-02-11 DIAGNOSIS — L02415 Cutaneous abscess of right lower limb: Principal | ICD-10-CM | POA: Diagnosis present

## 2015-02-11 DIAGNOSIS — E785 Hyperlipidemia, unspecified: Secondary | ICD-10-CM | POA: Diagnosis present

## 2015-02-11 DIAGNOSIS — I2510 Atherosclerotic heart disease of native coronary artery without angina pectoris: Secondary | ICD-10-CM | POA: Diagnosis present

## 2015-02-11 DIAGNOSIS — I10 Essential (primary) hypertension: Secondary | ICD-10-CM | POA: Diagnosis present

## 2015-02-11 DIAGNOSIS — L0291 Cutaneous abscess, unspecified: Secondary | ICD-10-CM | POA: Diagnosis present

## 2015-02-11 DIAGNOSIS — Z8614 Personal history of Methicillin resistant Staphylococcus aureus infection: Secondary | ICD-10-CM

## 2015-02-11 DIAGNOSIS — K219 Gastro-esophageal reflux disease without esophagitis: Secondary | ICD-10-CM | POA: Diagnosis present

## 2015-02-11 DIAGNOSIS — Z85828 Personal history of other malignant neoplasm of skin: Secondary | ICD-10-CM

## 2015-02-11 DIAGNOSIS — B9562 Methicillin resistant Staphylococcus aureus infection as the cause of diseases classified elsewhere: Secondary | ICD-10-CM | POA: Diagnosis present

## 2015-02-11 DIAGNOSIS — Z79899 Other long term (current) drug therapy: Secondary | ICD-10-CM | POA: Diagnosis not present

## 2015-02-11 DIAGNOSIS — Z951 Presence of aortocoronary bypass graft: Secondary | ICD-10-CM | POA: Diagnosis not present

## 2015-02-11 DIAGNOSIS — M179 Osteoarthritis of knee, unspecified: Secondary | ICD-10-CM | POA: Diagnosis present

## 2015-02-11 LAB — CBC WITH DIFFERENTIAL/PLATELET
Basophils Absolute: 0 10*3/uL (ref 0–0.1)
Basophils Relative: 0 %
EOS PCT: 1 %
Eosinophils Absolute: 0.1 10*3/uL (ref 0–0.7)
HEMATOCRIT: 36.2 % — AB (ref 40.0–52.0)
Hemoglobin: 12.5 g/dL — ABNORMAL LOW (ref 13.0–18.0)
LYMPHS PCT: 10 %
Lymphs Abs: 1 10*3/uL (ref 1.0–3.6)
MCH: 29.6 pg (ref 26.0–34.0)
MCHC: 34.6 g/dL (ref 32.0–36.0)
MCV: 85.5 fL (ref 80.0–100.0)
MONO ABS: 0.9 10*3/uL (ref 0.2–1.0)
MONOS PCT: 9 %
NEUTROS ABS: 7.8 10*3/uL — AB (ref 1.4–6.5)
Neutrophils Relative %: 80 %
PLATELETS: 167 10*3/uL (ref 150–440)
RBC: 4.23 MIL/uL — AB (ref 4.40–5.90)
RDW: 13.7 % (ref 11.5–14.5)
WBC: 9.8 10*3/uL (ref 3.8–10.6)

## 2015-02-11 LAB — COMPREHENSIVE METABOLIC PANEL
ALT: 25 U/L (ref 17–63)
ANION GAP: 10 (ref 5–15)
AST: 24 U/L (ref 15–41)
Albumin: 4.2 g/dL (ref 3.5–5.0)
Alkaline Phosphatase: 66 U/L (ref 38–126)
BILIRUBIN TOTAL: 0.9 mg/dL (ref 0.3–1.2)
BUN: 15 mg/dL (ref 6–20)
CALCIUM: 8.8 mg/dL — AB (ref 8.9–10.3)
CO2: 27 mmol/L (ref 22–32)
Chloride: 96 mmol/L — ABNORMAL LOW (ref 101–111)
Creatinine, Ser: 1.2 mg/dL (ref 0.61–1.24)
GFR calc Af Amer: >60 mL/min (ref >60)
GFR calc non Af Amer: 57 mL/min — ABNORMAL LOW (ref >60)
GLUCOSE: 164 mg/dL — AB (ref 65–99)
Potassium: 3.6 mmol/L (ref 3.5–5.1)
Sodium: 133 mmol/L — ABNORMAL LOW (ref 135–145)
Total Protein: 7.7 g/dL (ref 6.5–8.1)

## 2015-02-11 MED ORDER — POTASSIUM CHLORIDE CRYS ER 20 MEQ PO TBCR
20.0000 meq | EXTENDED_RELEASE_TABLET | Freq: Every evening | ORAL | Status: DC
  Administered 2015-02-12 – 2015-02-14 (×3): 20 meq via ORAL
  Filled 2015-02-11 (×3): qty 1

## 2015-02-11 MED ORDER — VANCOMYCIN HCL IN DEXTROSE 1-5 GM/200ML-% IV SOLN
1000.0000 mg | Freq: Once | INTRAVENOUS | Status: AC
  Administered 2015-02-11: 1000 mg via INTRAVENOUS
  Filled 2015-02-11: qty 200

## 2015-02-11 MED ORDER — MORPHINE SULFATE (PF) 4 MG/ML IV SOLN
INTRAVENOUS | Status: AC
  Administered 2015-02-11: 4 mg via INTRAVENOUS
  Filled 2015-02-11: qty 1

## 2015-02-11 MED ORDER — RIVAROXABAN 10 MG PO TABS
20.0000 mg | ORAL_TABLET | Freq: Every day | ORAL | Status: DC
  Administered 2015-02-12 – 2015-02-14 (×3): 20 mg via ORAL
  Filled 2015-02-11 (×3): qty 2

## 2015-02-11 MED ORDER — LIDOCAINE-EPINEPHRINE (PF) 1 %-1:200000 IJ SOLN
INTRAMUSCULAR | Status: AC
  Filled 2015-02-11: qty 30

## 2015-02-11 MED ORDER — AMLODIPINE BESYLATE 10 MG PO TABS
10.0000 mg | ORAL_TABLET | Freq: Every day | ORAL | Status: DC
  Administered 2015-02-13 – 2015-02-15 (×3): 10 mg via ORAL
  Filled 2015-02-11 (×4): qty 1

## 2015-02-11 MED ORDER — ATORVASTATIN CALCIUM 20 MG PO TABS
80.0000 mg | ORAL_TABLET | Freq: Every evening | ORAL | Status: DC
  Administered 2015-02-12 – 2015-02-14 (×4): 80 mg via ORAL
  Filled 2015-02-11 (×4): qty 4

## 2015-02-11 MED ORDER — VANCOMYCIN HCL IN DEXTROSE 1-5 GM/200ML-% IV SOLN
1000.0000 mg | Freq: Two times a day (BID) | INTRAVENOUS | Status: DC
  Administered 2015-02-12 – 2015-02-14 (×4): 1000 mg via INTRAVENOUS
  Filled 2015-02-11 (×6): qty 200

## 2015-02-11 MED ORDER — LIDOCAINE-EPINEPHRINE (PF) 1 %-1:200000 IJ SOLN
10.0000 mL | Freq: Once | INTRAMUSCULAR | Status: AC
  Administered 2015-02-11: 10 mL via INTRADERMAL

## 2015-02-11 MED ORDER — OXYCODONE-ACETAMINOPHEN 5-325 MG PO TABS
2.0000 | ORAL_TABLET | Freq: Once | ORAL | Status: AC
  Administered 2015-02-11: 2 via ORAL
  Filled 2015-02-11: qty 2

## 2015-02-11 MED ORDER — ATENOLOL 50 MG PO TABS
50.0000 mg | ORAL_TABLET | Freq: Every day | ORAL | Status: DC
  Administered 2015-02-13 – 2015-02-15 (×3): 50 mg via ORAL
  Filled 2015-02-11 (×4): qty 1

## 2015-02-11 MED ORDER — EZETIMIBE 10 MG PO TABS
10.0000 mg | ORAL_TABLET | Freq: Every day | ORAL | Status: DC
  Administered 2015-02-12 – 2015-02-15 (×4): 10 mg via ORAL
  Filled 2015-02-11 (×4): qty 1

## 2015-02-11 MED ORDER — MORPHINE SULFATE (PF) 4 MG/ML IV SOLN
4.0000 mg | Freq: Once | INTRAVENOUS | Status: AC
  Administered 2015-02-11: 4 mg via INTRAVENOUS

## 2015-02-11 MED ORDER — HYDROCHLOROTHIAZIDE 25 MG PO TABS
25.0000 mg | ORAL_TABLET | Freq: Every day | ORAL | Status: DC
Start: 2015-02-11 — End: 2015-02-15
  Administered 2015-02-13 – 2015-02-15 (×3): 25 mg via ORAL
  Filled 2015-02-11 (×4): qty 1

## 2015-02-11 MED ORDER — OMEGA-3-ACID ETHYL ESTERS 1 G PO CAPS
1.0000 g | ORAL_CAPSULE | Freq: Every day | ORAL | Status: DC
  Administered 2015-02-12 – 2015-02-15 (×4): 1 g via ORAL
  Filled 2015-02-11 (×4): qty 1

## 2015-02-11 MED ORDER — VANCOMYCIN HCL IN DEXTROSE 1-5 GM/200ML-% IV SOLN
1000.0000 mg | Freq: Once | INTRAVENOUS | Status: AC
  Administered 2015-02-11: 1000 mg via INTRAVENOUS
  Filled 2015-02-11 (×2): qty 200

## 2015-02-11 NOTE — ED Provider Notes (Addendum)
[facility] Emergency Department Provider Note     Time seen: ----------------------------------------- 3:23 PM on 02/11/2015 -----------------------------------------    I have reviewed the triage vital signs and the nursing notes.   HISTORY  Chief Complaint Recurrent Skin Infections    HPI Walter Mosley. is a 74 y.o. male who presents ER for redness swelling and open wound to his right lower extremity. Patient was told he had MRSA in the right lower leg couple days ago was placed on antibiotic. States symptoms are getting worse, has had increased redness and swelling and pain below the knee on the right leg. Denies any fevers chills currently.Patient has been on antibiotics for 3 days as well as topical antibiotic ointment with no improvement in his symptoms.   Past Medical History  Diagnosis Date  . Hyperlipidemia     Mixed  . GERD (gastroesophageal reflux disease)   . Osteoarthritis, knee   . Coronary artery disease     Artery bypass graft  . Atrial flutter ([place])     s/p ablation  . PSVT (paroxysmal supraventricular tachycardia) ([place])   . PAT (paroxysmal atrial tachycardia) ([place])   . Hypertension     Unspecified  . 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 Cancer ([place])     squamous cell carcimnoma right leg    Patient Active Problem List   Diagnosis Date Noted  . Chronic atrial fibrillation ([place]) 12/31/2013  . Pre-syncope 11/16/2012  . Coronary artery disease   . Hyperlipidemia 01/29/2009  . Essential hypertension 01/29/2009  . CAD, ARTERY BYPASS GRAFT 01/29/2009  . SVT/ PSVT/ PAT 01/29/2009  . ATRIAL FLUTTER 01/29/2009    Past Surgical History  Procedure Laterality Date  . Hemorrhoid surgery    . Coronary artery bypass graft  1993    CABG x 5 @ [place]  . Cardiac electrophysiology study and ablation    . Cardiac catheterization  1993  . Mohs surgery      Allergies Review of patient's allergies indicates  no known allergies.  Social History Social History  Substance Use Topics  . Smoking status: Never Smoker   . Smokeless tobacco: None  . Alcohol Use: No    Review of Systems Constitutional: Negative for fever. Eyes: Negative for visual changes. ENT: Negative for sore throat. Cardiovascular: Negative for chest pain. Respiratory: Negative for shortness of breath. Gastrointestinal: Negative for abdominal pain, vomiting and diarrhea. Genitourinary: Negative for dysuria. Musculoskeletal: Positive for right leg pain Skin: Positive for skin redness and swelling in the right lower extremity Neurological: Negative for headaches, focal weakness or numbness.  10-point ROS otherwise negative.  ____________________________________________   PHYSICAL EXAM:  VITAL SIGNS: ED Triage Vitals  Enc Vitals Group     BP 02/11/15 1410 112/62 mmHg     Pulse Rate 02/11/15 1411 83     Resp 02/11/15 1520 16     Temp 02/11/15 1410 98.4 F (36.9 C)     Temp Source 02/11/15 1410 Oral     SpO2 02/11/15 1411 96 %     Weight --      Height --      Head Cir --      Peak Flow --      Pain Score 02/11/15 1409 9     Pain Loc --      Pain Edu? --      Excl. in [place]? --     Constitutional: Alert and oriented. Well appearing and in no distress.  Eyes: Conjunctivae are normal. PERRL. Normal extraocular movements. ENT   Head: Normocephalic and atraumatic.   Nose: No congestion/rhinnorhea.   Mouth/Throat: Mucous membranes are moist.   Neck: No stridor. Cardiovascular: Normal rate, regular rhythm. Normal and symmetric distal pulses are present in all extremities. No murmurs, rubs, or gallops. Respiratory: Normal respiratory effort without tachypnea nor retractions. Breath sounds are clear and equal bilaterally. No wheezes/rales/rhonchi. Gastrointestinal: Soft and nontender. No distention. No abdominal bruits.  Musculoskeletal: Skin erythema, induration and swelling on the right lower extremity  below the knee. No joint effusions.   Neurologic:  Normal speech and language. No gross focal neurologic deficits are appreciated. Speech is normal. No gait instability. Skin:  Markedly erythema with cellulitis and obvious abscess formation the right lower extremity approximately mid tibia Psychiatric: Mood and affect are normal. Speech and behavior are normal. Patient exhibits appropriate insight and judgment. ____________________________________________  ED COURSE:  Pertinent labs & imaging results that were available during my care of the patient were reviewed by me and considered in my medical decision making (see chart for details). Patient's no acute distress, significant cellulitis with abscess formation. Patient will need incision and drainage and IV antibiotics. ____________________________________________   INCISION AND DRAINAGE Performed by: Lenise Arena E Consent: Verbal consent obtained. Risks and benefits: risks, benefits and alternatives were discussed Type: abscess  Body area: Right lower extremity  Anesthesia: local infiltration  Incision was made with a scalpel.  Local anesthetic: lidocaine 1 % with epinephrine  Anesthetic total: 5 ml  Complexity: complex Blunt dissection to break up loculations  Drainage: purulent  Drainage amount: Small   Packing material: 1/4 in iodoform gauze  Patient tolerance: Patient tolerated the procedure well with no immediate complications.    LABS (pertinent positives/negatives)  Labs Reviewed  COMPREHENSIVE METABOLIC PANEL - Abnormal; Notable for the following:    Sodium 133 (*)    Chloride 96 (*)    Glucose, Bld 164 (*)    Calcium 8.8 (*)    GFR calc non Af Amer 57 (*)    All other components within normal limits  CBC WITH DIFFERENTIAL/PLATELET - Abnormal; Notable for the following:    RBC 4.23 (*)    Hemoglobin 12.5 (*)    HCT 36.2 (*)    Neutro Abs 7.8 (*)    All other components within normal limits   CULTURE, BLOOD (ROUTINE X 2)  CULTURE, BLOOD (ROUTINE X 2)  WOUND CULTURE   ____________________________________________  FINAL ASSESSMENT AND PLAN  Abscess, cellulitis  Plan: Patient with labs as dictated above. Patient has failed outpatient treatment for abscess and cellulitis. Patient had some spontaneous drainage, wound was cultured here after abscess was drained by me. He was given IV antibiotics including IV vancomycin and will need to be admitted to ensure improvement.   Earleen Newport, MD   Earleen Newport, MD 02/11/15 [place], MD 02/11/15 [phone]

## 2015-02-11 NOTE — H&P (Signed)
[facility] [place] NAME: Walter Mosley    MR#:  [id number]  DATE OF BIRTH:  19-Feb-1941  DATE OF ADMISSION:  02/11/2015  PRIMARY CARE PHYSICIAN: Theotis Burrow, MD   REQUESTING/REFERRING PHYSICIAN: Mariea Clonts.  CHIEF COMPLAINT:   Chief Complaint  Patient presents with  . Recurrent Skin Infections    HISTORY OF PRESENT ILLNESS: Walter Mosley  is a 74 y.o. male with a known history of hyperlipidemia, coronary artery disease, atrial flutter on her Xarelto, MRSA infection, hypertension- had the small boil on his right leg and noticed 8-10 days ago he burst it and removed some small amount of secretion from there on his own and since then for last 1 week the surrounding area is getting reddish swollen and painful he went to his primary care doctor 3 days ago and he was given Bactrim tablets which he took for 2 days and did not get any relief but continued to get worse so came to emergency room.  PAST MEDICAL HISTORY:   Past Medical History  Diagnosis Date  . Hyperlipidemia     Mixed  . GERD (gastroesophageal reflux disease)   . Osteoarthritis, knee   . Coronary artery disease     Artery bypass graft  . Atrial flutter ([place])     s/p ablation  . PSVT (paroxysmal supraventricular tachycardia) ([place])   . PAT (paroxysmal atrial tachycardia) ([place])   . Hypertension     Unspecified  . 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 Cancer ([place])     squamous cell carcimnoma right leg    PAST SURGICAL HISTORY:  Past Surgical History  Procedure Laterality Date  . Hemorrhoid surgery    . Coronary artery bypass graft  1993    CABG x 5 @ [place] [facility]  . Cardiac electrophysiology study and ablation    . Cardiac catheterization  1993  . Mohs surgery      SOCIAL HISTORY:  Social History  Substance Use Topics  . Smoking status: Never Smoker   . Smokeless tobacco: Not on file  . Alcohol Use: No    FAMILY HISTORY:  Family History   Problem Relation Age of Onset  . Coronary artery disease Other   . Heart disease Father   . Heart attack Father 20    MI    DRUG ALLERGIES: No Known Allergies  REVIEW OF SYSTEMS:   CONSTITUTIONAL: No fever, fatigue or weakness.  EYES: No blurred or double vision.  EARS, NOSE, AND THROAT: No tinnitus or ear pain.  RESPIRATORY: No cough, shortness of breath, wheezing or hemoptysis.  CARDIOVASCULAR: No chest pain, orthopnea, edema.  GASTROINTESTINAL: No nausea, vomiting, diarrhea or abdominal pain.  GENITOURINARY: No dysuria, hematuria.  ENDOCRINE: No polyuria, nocturia,  HEMATOLOGY: No anemia, easy bruising or bleeding SKIN: right leg pain, redness, and swelling. MUSCULOSKELETAL: No joint pain or arthritis.   NEUROLOGIC: No tingling, numbness, weakness.  PSYCHIATRY: No anxiety or depression.   MEDICATIONS AT HOME:  Prior to Admission medications   Medication Sig Start Date End Date Taking? Authorizing Provider  amLODipine (NORVASC) 10 MG tablet Take 10 mg by mouth daily.    Yes Historical Provider, MD  atenolol (TENORMIN) 50 MG tablet Take 50 mg by mouth daily.    Yes Historical Provider, MD  atorvastatin (LIPITOR) 80 MG tablet Take 80 mg by mouth every evening.    Yes Historical Provider, MD  ezetimibe (ZETIA) 10 MG tablet Take 1 tablet (10 mg  total) by mouth daily. 01/17/15  Yes Wellington Hampshire, MD  hydrochlorothiazide (HYDRODIURIL) 25 MG tablet Take 25 mg by mouth daily.    Yes Historical Provider, MD  mupirocin ointment (BACTROBAN) 2 % Apply 1 application topically 3 (three) times daily. 02/08/15  Yes Frederich Cha, MD  Omega-3 Fatty Acids (FISH OIL) 1200 MG CAPS Take 1,200 mg by mouth 2 (two) times daily.   Yes Historical Provider, MD  potassium chloride (K-DUR) 10 MEQ tablet Take 20 mEq by mouth every evening.    Yes Historical Provider, MD  rivaroxaban (XARELTO) 20 MG TABS tablet Take 1 tablet (20 mg total) by mouth daily with supper. 01/16/15  Yes Wellington Hampshire, MD   sulfamethoxazole-trimethoprim (BACTRIM DS,SEPTRA DS) 800-160 MG tablet Take 1 tablet by mouth 2 (two) times daily. 02/08/15  Yes Frederich Cha, MD      PHYSICAL EXAMINATION:   VITAL SIGNS: Blood pressure 130/83, pulse 80, temperature 98.4 F (36.9 C), temperature source Oral, resp. rate 16, SpO2 97 %.  GENERAL:  74 y.o.-year-old patient lying in the bed with no acute distress.  EYES: Pupils equal, round, reactive to light and accommodation. No scleral icterus. Extraocular muscles intact.  HEENT: Head atraumatic, normocephalic. Oropharynx and nasopharynx clear.  NECK:  Supple, no jugular venous distention. No thyroid enlargement, no tenderness.  LUNGS: Normal breath sounds bilaterally, no wheezing, rales,rhonchi or crepitation. No use of accessory muscles of respiration.  CARDIOVASCULAR: S1, S2 normal. No murmurs, rubs, or gallops.  ABDOMEN: Soft, nontender, nondistended. Bowel sounds present. No organomegaly or mass.  EXTREMITIES: No pedal edema, cyanosis, or clubbing.  NEUROLOGIC: Cranial nerves II through XII are intact. Muscle strength 5/5 in all extremities. Sensation intact. Gait not checked.  PSYCHIATRIC: The patient is alert and oriented x 3.  SKIN: Right leg redness, swelling, tender to touch. The dressing present after ER did IND.  LABORATORY PANEL:   CBC  Recent Labs Lab 02/11/15 1417  WBC 9.8  HGB 12.5*  HCT 36.2*  PLT 167  MCV 85.5  MCH 29.6  MCHC 34.6  RDW 13.7  LYMPHSABS 1.0  MONOABS 0.9  EOSABS 0.1  BASOSABS 0.0   ------------------------------------------------------------------------------------------------------------------  Chemistries   Recent Labs Lab 02/11/15 1417  NA 133*  K 3.6  CL 96*  CO2 27  GLUCOSE 164*  BUN 15  CREATININE 1.20  CALCIUM 8.8*  AST 24  ALT 25  ALKPHOS 66  BILITOT 0.9   ------------------------------------------------------------------------------------------------------------------ estimated creatinine clearance  is 65.3 mL/min (by C-G formula based on Cr of 1.2). ------------------------------------------------------------------------------------------------------------------ No results for input(s): TSH, T4TOTAL, T3FREE, THYROIDAB in the last 72 hours.  Invalid input(s): FREET3   Coagulation profile No results for input(s): INR, PROTIME in the last 168 hours. ------------------------------------------------------------------------------------------------------------------- No results for input(s): DDIMER in the last 72 hours. -------------------------------------------------------------------------------------------------------------------  Cardiac Enzymes No results for input(s): CKMB, TROPONINI, MYOGLOBIN in the last 168 hours.  Invalid input(s): CK ------------------------------------------------------------------------------------------------------------------ Invalid input(s): POCBNP  ---------------------------------------------------------------------------------------------------------------  Urinalysis No results found for: COLORURINE, APPEARANCEUR, LABSPEC, PHURINE, GLUCOSEU, HGBUR, BILIRUBINUR, KETONESUR, PROTEINUR, UROBILINOGEN, NITRITE, LEUKOCYTESUR   RADIOLOGY: No results found.  EKG: Orders placed or performed in visit on 01/16/15  . EKG 12-Lead    IMPRESSION AND PLAN:  * Cellulitis  Patient has history of MRSA, he failed Bactrim therapy as outpatient.  Admitted to hospital, start on IV vancomycin.  Cultures are sent by ER physician.  * Paroxysmal atrial fibrillation]  Rate is under control continue Xarelto.  * Hypertension  Blood pressure under control continue home  medications  * Coronary artery disease  Continue home medications.   All the records are reviewed and case discussed with ED provider. Management plans discussed with the patient, family and they are in agreement.  CODE STATUS: Full code    TOTAL TIME TAKING CARE OF THIS PATIENT: 50  minutes.    Vaughan Basta M.D on 02/11/2015   Between 7am to 6pm - Pager - [phone]  After 6pm go to [email or web address] - password [email or web address] [facility]  Office  [phone]  CC: Primary care physician; Theotis Burrow, MD   Note: This dictation was prepared with Dragon dictation along with smaller phrase technology. Any transcriptional errors that result from this process are unintentional.

## 2015-02-11 NOTE — ED Notes (Signed)
[place] and 1st blood culture sent when inserting IV.

## 2015-02-11 NOTE — ED Notes (Signed)
Dr. Jimmye Norman notified of patient having pain.  Verbal order received.

## 2015-02-11 NOTE — Progress Notes (Signed)
ANTIBIOTIC CONSULT NOTE - INITIAL  Pharmacy Consult for VANCOMYCIN  Indication: cellulitis  No Known Allergies  Patient Measurements:  Ht 81ft 4 in Wt 93 kg IBW 77.6 Adjusted Body Weight: 83.8 (only 19% over IBW)  Vital Signs: Temp: 98.4 F (36.9 C) (11/01 1410) Temp Source: Oral (11/01 1410) BP: 130/83 mmHg (11/01 1600) Pulse Rate: 80 (11/01 1600) Intake/Output from previous day:   Intake/Output from this shift:    Labs:  Recent Labs  02/11/15 1417  WBC 9.8  HGB 12.5*  PLT 167  CREATININE 1.20   Estimated Creatinine Clearance: 65.3 mL/min (by C-G formula based on Cr of 1.2).   Microbiology: No results found for this or any previous visit (from the past 720 hour(s)).  Medical History: Past Medical History  Diagnosis Date  . Hyperlipidemia     Mixed  . GERD (gastroesophageal reflux disease)   . Osteoarthritis, knee   . Coronary artery disease     Artery bypass graft  . Atrial flutter ([place])     s/p ablation  . PSVT (paroxysmal supraventricular tachycardia) ([place])   . PAT (paroxysmal atrial tachycardia) ([place])   . Hypertension     Unspecified  . 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 Cancer ([place])     squamous cell carcimnoma right leg    Medications:  Scheduled:   Anti-infectives    Start     Dose/Rate Route Frequency Ordered Stop   02/12/15 1100  vancomycin (VANCOCIN) IVPB 1000 mg/200 mL premix     1,000 mg 200 mL/hr over 60 Minutes Intravenous Every 12 hours 02/11/15 1708     02/11/15 2300  vancomycin (VANCOCIN) IVPB 1000 mg/200 mL premix     1,000 mg 200 mL/hr over 60 Minutes Intravenous  Once 02/11/15 1708     02/11/15 1530  vancomycin (VANCOCIN) IVPB 1000 mg/200 mL premix     1,000 mg 200 mL/hr over 60 Minutes Intravenous  Once 02/11/15 1522 02/11/15 1642     Assessment: 74 yo Male with cellulitis, boil on leg, history of MRSA, he failed Bactrim therapy as outpatient.  Goal of Therapy:  Vancomycin trough level 15-20  mcg/ml  Plan:  Measure antibiotic drug levels at steady state  Bcx, WoundCx pending Patient received Vancomycin 1 gram in ER x1. Will give Vancomycin 1 gram IV in ~7 hrs after 1st dose for stacked dosing. Will then continue with Vancomycin 1 gram IV q12h. Will schedule trough prior to 5th dose on 11/3 at [facility] Pharmacist 02/11/2015 5:15 PM

## 2015-02-11 NOTE — ED Notes (Signed)
C/o reddness, swelling and open wound to right lower leg, was told he had MRSA to right lower leg a couple of days ago and placed on antibiotic, but states symptoms are getting worse

## 2015-02-11 NOTE — ED Notes (Signed)
Dr. Jimmye Norman gave verbal order for lido/epi for I & D of right lower leg.

## 2015-02-12 DIAGNOSIS — L03115 Cellulitis of right lower limb: Secondary | ICD-10-CM

## 2015-02-12 DIAGNOSIS — L0291 Cutaneous abscess, unspecified: Secondary | ICD-10-CM

## 2015-02-12 LAB — BASIC METABOLIC PANEL
ANION GAP: 9 (ref 5–15)
BUN: 15 mg/dL (ref 6–20)
CALCIUM: 8.4 mg/dL — AB (ref 8.9–10.3)
CHLORIDE: 100 mmol/L — AB (ref 101–111)
CO2: 26 mmol/L (ref 22–32)
Creatinine, Ser: 1.05 mg/dL (ref 0.61–1.24)
GFR calc non Af Amer: >60 mL/min (ref >60)
Glucose, Bld: 133 mg/dL — ABNORMAL HIGH (ref 65–99)
Potassium: 3.6 mmol/L (ref 3.5–5.1)
Sodium: 135 mmol/L (ref 135–145)

## 2015-02-12 LAB — CBC
HCT: 33.6 % — ABNORMAL LOW (ref 40.0–52.0)
HEMOGLOBIN: 11.9 g/dL — AB (ref 13.0–18.0)
MCH: 30.4 pg (ref 26.0–34.0)
MCHC: 35.4 g/dL (ref 32.0–36.0)
MCV: 85.9 fL (ref 80.0–100.0)
Platelets: 161 10*3/uL (ref 150–440)
RBC: 3.92 MIL/uL — AB (ref 4.40–5.90)
RDW: 13.6 % (ref 11.5–14.5)
WBC: 10.1 10*3/uL (ref 3.8–10.6)

## 2015-02-12 MED ORDER — MORPHINE SULFATE (PF) 2 MG/ML IV SOLN
2.0000 mg | INTRAVENOUS | Status: DC | PRN
  Administered 2015-02-12: 2 mg via INTRAVENOUS
  Filled 2015-02-12: qty 1

## 2015-02-12 MED ORDER — DIPHENHYDRAMINE HCL 25 MG PO CAPS: 25.0000 mg | ORAL_CAPSULE | Freq: Every evening | ORAL | Status: DC | PRN

## 2015-02-12 MED ORDER — OXYCODONE-ACETAMINOPHEN 5-325 MG PO TABS
1.0000 | ORAL_TABLET | Freq: Four times a day (QID) | ORAL | Status: DC | PRN
  Administered 2015-02-12 – 2015-02-14 (×7): 1 via ORAL
  Filled 2015-02-12 (×7): qty 1

## 2015-02-12 MED ORDER — ACETAMINOPHEN 325 MG PO TABS: 650.0000 mg | ORAL_TABLET | Freq: Four times a day (QID) | ORAL | Status: DC | PRN

## 2015-02-12 MED ORDER — ZOLPIDEM TARTRATE 5 MG PO TABS
5.0000 mg | ORAL_TABLET | Freq: Every evening | ORAL | Status: DC | PRN
  Filled 2015-02-12: qty 1

## 2015-02-12 NOTE — Progress Notes (Signed)
Notified Dr Marcille Blanco of pt's need for pain medication

## 2015-02-12 NOTE — Progress Notes (Signed)
Pts BP 92/69 HR 91, MD Manuella Ghazi notified per MD hold BP meds scheduled this am. (See MAR). Will continue to monitor.

## 2015-02-12 NOTE — Consult Note (Signed)
Surgical Consultation  02/12/2015  Walter Flor. is an 74 y.o. male.   CC: Right leg abscess  HPI: This patient with a history of increasing swelling and pain and redness to the right anterior leg with no history of trauma. He did have a "carcinoma" excised via Mohs surgery in September presumably with negative margins. This is adjacent to the area of I&D that was performed last night but does not seem to be involved nor the etiology. His never had an episode like this before. He states he feels better this afternoon but this morning he was actually worse than he was last night.  Past Medical History  Diagnosis Date  . Hyperlipidemia     Mixed  . GERD (gastroesophageal reflux disease)   . Osteoarthritis, knee   . Coronary artery disease     Artery bypass graft  . Atrial flutter ([place])     s/p ablation  . PSVT (paroxysmal supraventricular tachycardia) ([place])   . PAT (paroxysmal atrial tachycardia) ([place])   . Hypertension     Unspecified  . 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 Cancer ([place])     squamous cell carcimnoma right leg    Past Surgical History  Procedure Laterality Date  . Hemorrhoid surgery    . Coronary artery bypass graft  1993    CABG x 5 @ [facility]  . Cardiac electrophysiology study and ablation    . Cardiac catheterization  1993  . Mohs surgery      Family History  Problem Relation Age of Onset  . Coronary artery disease Other   . Heart disease Father   . Heart attack Father 20    MI    Social History:  reports that he has never smoked. He does not have any smokeless tobacco history on file. He reports that he does not drink alcohol or use illicit drugs.  Allergies: No Known Allergies  Medications reviewed.   Review of Systems:   Review of Systems  Constitutional: Negative for fever and chills.  HENT: Negative.   Eyes: Negative.   Respiratory: Negative.   Cardiovascular: Negative.   Gastrointestinal: Negative.    Genitourinary: Negative.   Musculoskeletal: Negative.   Skin: Negative.   Neurological: Negative.   Endo/Heme/Allergies: Negative.   Psychiatric/Behavioral: Negative.      Physical Exam:  BP 92/69 mmHg  Pulse 91  Temp(Src) 98.5 F (36.9 C) (Oral)  Resp 18  Ht 6' 4"  (1.93 m)  Wt 205 lb (92.987 kg)  BMI 24.96 kg/m2  SpO2 94%  Physical Exam  Constitutional: He is oriented to person, place, and time and well-developed, well-nourished, and in no distress. No distress.  HENT:  Head: Normocephalic.  Eyes: No scleral icterus.  Pulmonary/Chest: Effort normal.  Musculoskeletal: Normal range of motion. He exhibits edema and tenderness.  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 I&D site with iodoform packing present packing is removed minimal purulence exudes.  Neurological: He is alert and oriented to person, place, and time.  Skin: Skin is warm. No rash noted. There is erythema. No pallor.      Results for orders placed or performed during the hospital encounter of 02/11/15 (from the past 48 hour(s))  Comprehensive metabolic panel     Status: Abnormal   Collection Time: 02/11/15  2:17 PM  Result Value Ref Range   Sodium 133 (L) 135 - 145 mmol/L   Potassium 3.6 3.5 - 5.1 mmol/L   Chloride  96 (L) 101 - 111 mmol/L   CO2 27 22 - 32 mmol/L   Glucose, Bld 164 (H) 65 - 99 mg/dL   BUN 15 6 - 20 mg/dL   Creatinine, Ser 1.20 0.61 - 1.24 mg/dL   Calcium 8.8 (L) 8.9 - 10.3 mg/dL   Total Protein 7.7 6.5 - 8.1 g/dL   Albumin 4.2 3.5 - 5.0 g/dL   AST 24 15 - 41 U/L   ALT 25 17 - 63 U/L   Alkaline Phosphatase 66 38 - 126 U/L   Total Bilirubin 0.9 0.3 - 1.2 mg/dL   GFR calc non Af Amer 57 (L) >60 mL/min   GFR calc Af Amer >60 >60 mL/min    Comment: (NOTE) The eGFR has been calculated using the CKD EPI equation. This calculation has not been validated in all clinical situations. eGFR's persistently <60 mL/min signify possible Chronic  Kidney Disease.    Anion gap 10 5 - 15  CBC with Differential     Status: Abnormal   Collection Time: 02/11/15  2:17 PM  Result Value Ref Range   WBC 9.8 3.8 - 10.6 K/uL   RBC 4.23 (L) 4.40 - 5.90 MIL/uL   Hemoglobin 12.5 (L) 13.0 - 18.0 g/dL   HCT 36.2 (L) 40.0 - 52.0 %   MCV 85.5 80.0 - 100.0 fL   MCH 29.6 26.0 - 34.0 pg   MCHC 34.6 32.0 - 36.0 g/dL   RDW 13.7 11.5 - 14.5 %   Platelets 167 150 - 440 K/uL   Neutrophils Relative % 80 %   Neutro Abs 7.8 (H) 1.4 - 6.5 K/uL   Lymphocytes Relative 10 %   Lymphs Abs 1.0 1.0 - 3.6 K/uL   Monocytes Relative 9 %   Monocytes Absolute 0.9 0.2 - 1.0 K/uL   Eosinophils Relative 1 %   Eosinophils Absolute 0.1 0 - 0.7 K/uL   Basophils Relative 0 %   Basophils Absolute 0.0 0 - 0.1 K/uL  Culture, blood (routine x 2)     Status: None (Preliminary result)   Collection Time: 02/11/15  2:17 PM  Result Value Ref Range   Specimen Description BLOOD RIGHT ASSIST CONTROL    Special Requests BOTTLES DRAWN AEROBIC AND ANAEROBIC 6CC    Culture NO GROWTH < 24 HOURS    Report Status PENDING   Culture, blood (routine x 2)     Status: None (Preliminary result)   Collection Time: 02/11/15  3:20 PM  Result Value Ref Range   Specimen Description BLOOD LEFT ASSIST CONTROL    Special Requests BOTTLES DRAWN AEROBIC AND ANAEROBIC 5CC    Culture NO GROWTH < 24 HOURS    Report Status PENDING   Wound culture     Status: None (Preliminary result)   Collection Time: 02/11/15  3:43 PM  Result Value Ref Range   Specimen Description ABSCESS    Special Requests NONE    Gram Stain      MODERATE WBC SEEN FEW GRAM NEGATIVE RODS RARE GRAM POSITIVE COCCI    Culture      LIGHT GROWTH STAPHYLOCOCCUS AUREUS SUSCEPTIBILITIES TO FOLLOW    Report Status PENDING   Basic metabolic panel     Status: Abnormal   Collection Time: 02/12/15  5:39 AM  Result Value Ref Range   Sodium 135 135 - 145 mmol/L   Potassium 3.6 3.5 - 5.1 mmol/L   Chloride 100 (L) 101 - 111 mmol/L    CO2 26 22 - 32 mmol/L  Glucose, Bld 133 (H) 65 - 99 mg/dL   BUN 15 6 - 20 mg/dL   Creatinine, Ser 1.05 0.61 - 1.24 mg/dL   Calcium 8.4 (L) 8.9 - 10.3 mg/dL   GFR calc non Af Amer >60 >60 mL/min   GFR calc Af Amer >60 >60 mL/min    Comment: (NOTE) The eGFR has been calculated using the CKD EPI equation. This calculation has not been validated in all clinical situations. eGFR's persistently <60 mL/min signify possible Chronic Kidney Disease.    Anion gap 9 5 - 15  CBC     Status: Abnormal   Collection Time: 02/12/15  5:39 AM  Result Value Ref Range   WBC 10.1 3.8 - 10.6 K/uL   RBC 3.92 (L) 4.40 - 5.90 MIL/uL   Hemoglobin 11.9 (L) 13.0 - 18.0 g/dL   HCT 33.6 (L) 40.0 - 52.0 %   MCV 85.9 80.0 - 100.0 fL   MCH 30.4 26.0 - 34.0 pg   MCHC 35.4 32.0 - 36.0 g/dL   RDW 13.6 11.5 - 14.5 %   Platelets 161 150 - 440 K/uL   No results found.  Assessment/Plan:  Cellulitis and abscess without a history of trauma right anterior leg I&D was performed in the emergency room last night minimal purulence noted after dressing change today. Recommend changing dressing Q8 orders of been entered I will follow the patient if he does not improve he would require exploration in the operating room  Florene Glen, MD, FACS

## 2015-02-12 NOTE — Progress Notes (Signed)
[facility] [place] NAME: Walter Mosley    MR#:  [id number]  DATE OF BIRTH:  11/05/1940  SUBJECTIVE:  CHIEF COMPLAINT:   Chief Complaint  Patient presents with  . Recurrent Skin Infections  in lots of pain and tight skin at The site of infection REVIEW OF SYSTEMS:  Review of Systems  Constitutional: Negative for fever, weight loss, malaise/fatigue and diaphoresis.  HENT: Negative for ear discharge, ear pain, hearing loss, nosebleeds, sore throat and tinnitus.   Eyes: Negative for blurred vision and pain.  Respiratory: Negative for cough, hemoptysis, shortness of breath and wheezing.   Cardiovascular: Negative for chest pain, palpitations, orthopnea and leg swelling.  Gastrointestinal: Negative for heartburn, nausea, vomiting, abdominal pain, diarrhea, constipation and blood in stool.  Genitourinary: Negative for dysuria, urgency and frequency.  Musculoskeletal: Negative for myalgias and back pain.  Skin: Positive for rash. Negative for itching.  Neurological: Negative for dizziness, tingling, tremors, focal weakness, seizures, weakness and headaches.  Psychiatric/Behavioral: Negative for depression. The patient is not nervous/anxious.    DRUG ALLERGIES:  No Known Allergies VITALS:  Blood pressure 92/69, pulse 91, temperature 98.5 F (36.9 C), temperature source Oral, resp. rate 18, height 6\' 4"  (1.93 m), weight 92.987 kg (205 lb), SpO2 94 %. PHYSICAL EXAMINATION:  Physical Exam  Constitutional: He is oriented to person, place, and time and well-developed, well-nourished, and in no distress.  HENT:  Head: Normocephalic and atraumatic.  Eyes: Conjunctivae and EOM are normal. Pupils are equal, round, and reactive to light.  Neck: Normal range of motion. Neck supple. No tracheal deviation present. No thyromegaly present.  Cardiovascular: Normal rate, regular rhythm and normal heart sounds.   Pulmonary/Chest: Effort normal and breath  sounds normal. No respiratory distress. He has no wheezes. He exhibits no tenderness.  Abdominal: Soft. Bowel sounds are normal. He exhibits no distension. There is no tenderness.  Musculoskeletal: Normal range of motion.  Neurological: He is alert and oriented to person, place, and time. No cranial nerve deficit.  Skin: Skin is warm and dry. Lesion and rash noted. Rash is pustular. There is erythema.     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 I&D site with iodoform packing present   Psychiatric: Mood and affect normal.   LABORATORY PANEL:   CBC  Recent Labs Lab 02/12/15 0539  WBC 10.1  HGB 11.9*  HCT 33.6*  PLT 161   ------------------------------------------------------------------------------------------------------------------ Chemistries   Recent Labs Lab 02/11/15 1417 02/12/15 0539  NA 133* 135  K 3.6 3.6  CL 96* 100*  CO2 27 26  GLUCOSE 164* 133*  BUN 15 15  CREATININE 1.20 1.05  CALCIUM 8.8* 8.4*  AST 24  --   ALT 25  --   ALKPHOS 66  --   BILITOT 0.9  --    RADIOLOGY:  No results found. ASSESSMENT AND PLAN:   * Cellulitis and abscess: Status post incision and drainage in the emergency department Patient has history of MRSA, he failed Bactrim therapy as outpatient. continue IV vancomycin. Cultures growing staph aureus. Appreciate surgical input - recommends dressing changes every 8 hours and if not, further improvement may require surgical exposure in the operating room tomorrow  * Paroxysmal atrial fibrillation] Rate is under control continue Xarelto.  * Hypertension Blood pressure under control continue home medications  * Coronary artery disease Continue home medications.     All the records are reviewed and case discussed with Care  Management/Social Worker. Management plans discussed with the patient, family and they are in agreement.  CODE STATUS: Full code  TOTAL TIME TAKING CARE OF THIS  PATIENT: 35 minutes.   More than 50% of the time was spent in counseling/coordination of care: YES  POSSIBLE D/C IN 1-2 DAYS, DEPENDING ON CLINICAL CONDITION.   [facility], Daliyah Sramek M.D on 02/12/2015 at 4:33 PM  Between 7am to 6pm - Pager - [phone]  After 6pm go to [email or web address] - password [email or web address] [facility]  Office  [phone]  CC: Primary care physician; Theotis Burrow, MD

## 2015-02-12 NOTE — Progress Notes (Signed)
Per MD Manuella Ghazi ok to give Xarelto. Will continue to monitor

## 2015-02-13 LAB — WOUND CULTURE

## 2015-02-13 LAB — VANCOMYCIN, TROUGH: Vancomycin Tr: 9 ug/mL — ABNORMAL LOW (ref 10–20)

## 2015-02-13 MED ORDER — DIPHENHYDRAMINE HCL 25 MG PO CAPS
25.0000 mg | ORAL_CAPSULE | Freq: Every evening | ORAL | Status: DC | PRN
  Administered 2015-02-13 – 2015-02-15 (×2): 25 mg via ORAL
  Filled 2015-02-13 (×2): qty 1

## 2015-02-13 NOTE — Progress Notes (Signed)
Lab called to notify that wound culture is positive for MRSA. MD Manuella Ghazi notified. Will continue to monitor.

## 2015-02-13 NOTE — Progress Notes (Signed)
[place] [facility] [place] NAME: Walter Mosley    MR#:  [id number]  DATE OF BIRTH:  1940-09-22  SUBJECTIVE:  CHIEF COMPLAINT:   Chief Complaint  Patient presents with  . Recurrent Skin Infections  less pain, feels some better than y'day. Slept well with benadryl REVIEW OF SYSTEMS:  Review of Systems  Constitutional: Negative for fever, weight loss, malaise/fatigue and diaphoresis.  HENT: Negative for ear discharge, ear pain, hearing loss, nosebleeds, sore throat and tinnitus.   Eyes: Negative for blurred vision and pain.  Respiratory: Negative for cough, hemoptysis, shortness of breath and wheezing.   Cardiovascular: Negative for chest pain, palpitations, orthopnea and leg swelling.  Gastrointestinal: Negative for heartburn, nausea, vomiting, abdominal pain, diarrhea, constipation and blood in stool.  Genitourinary: Negative for dysuria, urgency and frequency.  Musculoskeletal: Negative for myalgias and back pain.  Skin: Positive for rash. Negative for itching.  Neurological: Negative for dizziness, tingling, tremors, focal weakness, seizures, weakness and headaches.  Psychiatric/Behavioral: Negative for depression. The patient is not nervous/anxious.    DRUG ALLERGIES:  No Known Allergies VITALS:  Blood pressure 134/68, pulse 85, temperature 98.6 F (37 C), temperature source Oral, resp. rate 18, height 6\' 4"  (1.93 m), weight 92.987 kg (205 lb), SpO2 95 %. PHYSICAL EXAMINATION:  Physical Exam  Constitutional: He is oriented to person, place, and time and well-developed, well-nourished, and in no distress.  HENT:  Head: Normocephalic and atraumatic.  Eyes: Conjunctivae and EOM are normal. Pupils are equal, round, and reactive to light.  Neck: Normal range of motion. Neck supple. No tracheal deviation present. No thyromegaly present.  Cardiovascular: Normal rate, regular rhythm and normal heart sounds.   Pulmonary/Chest: Effort normal and  breath sounds normal. No respiratory distress. He has no wheezes. He exhibits no tenderness.  Abdominal: Soft. Bowel sounds are normal. He exhibits no distension. There is no tenderness.  Musculoskeletal: Normal range of motion.  Neurological: He is alert and oriented to person, place, and time. No cranial nerve deficit.  Skin: Skin is warm and dry. Lesion and rash noted. Rash is pustular. There is erythema.     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 I&D site with iodoform packing present   Psychiatric: Mood and affect normal.   LABORATORY PANEL:   CBC  Recent Labs Lab 02/12/15 0539  WBC 10.1  HGB 11.9*  HCT 33.6*  PLT 161   ------------------------------------------------------------------------------------------------------------------ Chemistries   Recent Labs Lab 02/11/15 1417 02/12/15 0539  NA 133* 135  K 3.6 3.6  CL 96* 100*  CO2 27 26  GLUCOSE 164* 133*  BUN 15 15  CREATININE 1.20 1.05  CALCIUM 8.8* 8.4*  AST 24  --   ALT 25  --   ALKPHOS 66  --   BILITOT 0.9  --    RADIOLOGY:  No results found. ASSESSMENT AND PLAN:   * Cellulitis and abscess: Status post incision and drainage in the emergency department Patient has history of MRSA, he failed Bactrim therapy as outpatient. continue IV vancomycin. Looks some better than before Cultures growing staph aureus. Appreciate surgical input - recommends dressing changes every 8 hours and if not, further improvement may require surgical exposure in the operating room tomorrow  * Paroxysmal atrial fibrillation] Rate is under control continue Xarelto.  * Hypertension Blood pressure under control continue home medications  * Coronary artery disease Continue home medications.     All the records are reviewed and  case discussed with Care Management/Social Worker. Management plans discussed with the patient, family and they are in agreement.  CODE STATUS: Full  code  TOTAL TIME TAKING CARE OF THIS PATIENT: 35 minutes.   More than 50% of the time was spent in counseling/coordination of care: YES  POSSIBLE D/C IN 1-2 DAYS, DEPENDING ON CLINICAL CONDITION and surgical input.   [facility], Stasia Somero M.D on 02/13/2015 at 7:54 AM  Between 7am to 6pm - Pager - [phone]  After 6pm go to [email or web address] - password [email or web address] [facility]  Office  [phone]  CC: Primary care physician; Theotis Burrow, MD

## 2015-02-13 NOTE — Progress Notes (Signed)
CC: Right leg abscess Subjective: This is a patient who is status post I&D of a right leg abscess done in the emergency room by the emergency room physicians. He states he is feeling much better today with less pain and no fevers or chills.  Objective: Vital signs in last 24 hours: Temp:  [98 F (36.7 C)-99.4 F (37.4 C)] 98 F (36.7 C) (11/03 1611) Pulse Rate:  [85-89] 87 (11/03 1611) Resp:  [16-18] 16 (11/03 1611) BP: (124-140)/(58-72) 124/58 mmHg (11/03 1611) SpO2:  [94 %-95 %] 95 % (11/03 1611) Last BM Date: 02/13/15 (per pt)  Intake/Output from previous day: 11/02 0701 - 11/03 0700 In: -  Out: 510 [Urine:510] Intake/Output this shift: Total I/O In: 240 [P.O.:240] Out: -   Physical exam:  Inspection demonstrates lessened erythema lessened edema minimal purulence there was no packing present in the wound. Calves are nontender.  Lab Results: CBC   Recent Labs  02/11/15 1417 02/12/15 0539  WBC 9.8 10.1  HGB 12.5* 11.9*  HCT 36.2* 33.6*  PLT 167 161   BMET  Recent Labs  02/11/15 1417 02/12/15 0539  NA 133* 135  K 3.6 3.6  CL 96* 100*  CO2 27 26  GLUCOSE 164* 133*  BUN 15 15  CREATININE 1.20 1.05  CALCIUM 8.8* 8.4*   PT/INR No results for input(s): LABPROT, INR in the last 72 hours. ABG No results for input(s): PHART, HCO3 in the last 72 hours.  Invalid input(s): PCO2, PO2  Studies/Results: No results found.  Anti-infectives: Anti-infectives    Start     Dose/Rate Route Frequency Ordered Stop   02/12/15 1100  vancomycin (VANCOCIN) IVPB 1000 mg/200 mL premix     1,000 mg 200 mL/hr over 60 Minutes Intravenous Every 12 hours 02/11/15 1708     02/11/15 2300  vancomycin (VANCOCIN) IVPB 1000 mg/200 mL premix     1,000 mg 200 mL/hr over 60 Minutes Intravenous  Once 02/11/15 1708 02/12/15 0000   02/11/15 1530  vancomycin (VANCOCIN) IVPB 1000 mg/200 mL premix     1,000 mg 200 mL/hr over 60 Minutes Intravenous  Once 02/11/15 1522 02/11/15 1642       Assessment/Plan:  Discussed with RN the need for packing every shift. Patient seems to be improved on IV antibiotics recommend continued current therapy but packing will be placed now on a every shift basis I doubt the patient will require surgical intervention.  Florene Glen, MD, FACS  02/13/2015

## 2015-02-13 NOTE — Progress Notes (Signed)
Notified Dr Lavetta Nielsen of pt's request for sleep aide

## 2015-02-13 NOTE — Progress Notes (Signed)
Pt stated ambien causes hallucinations. Notified Dr Lavetta Nielsen for new order

## 2015-02-13 NOTE — Care Management Important Message (Signed)
Important Message  Patient Details  Name: Walter Mosley. MRN: [id number] Date of Birth: 1940-11-23   Medicare Important Message Given:  Yes-second notification given    Bary Limbach, RN 02/13/2015, 8:52 AM

## 2015-02-14 LAB — BASIC METABOLIC PANEL
Anion gap: 6 (ref 5–15)
BUN: 13 mg/dL (ref 6–20)
CALCIUM: 8.3 mg/dL — AB (ref 8.9–10.3)
CO2: 28 mmol/L (ref 22–32)
CREATININE: 0.86 mg/dL (ref 0.61–1.24)
Chloride: 102 mmol/L (ref 101–111)
GFR calc non Af Amer: >60 mL/min (ref >60)
Glucose, Bld: 128 mg/dL — ABNORMAL HIGH (ref 65–99)
Potassium: 3.7 mmol/L (ref 3.5–5.1)
SODIUM: 136 mmol/L (ref 135–145)

## 2015-02-14 LAB — CBC
HCT: 31.6 % — ABNORMAL LOW (ref 40.0–52.0)
Hemoglobin: 10.9 g/dL — ABNORMAL LOW (ref 13.0–18.0)
MCH: 29.6 pg (ref 26.0–34.0)
MCHC: 34.6 g/dL (ref 32.0–36.0)
MCV: 85.5 fL (ref 80.0–100.0)
Platelets: 211 10*3/uL (ref 150–440)
RBC: 3.7 MIL/uL — ABNORMAL LOW (ref 4.40–5.90)
RDW: 13.4 % (ref 11.5–14.5)
WBC: 6.6 10*3/uL (ref 3.8–10.6)

## 2015-02-14 MED ORDER — DOXYCYCLINE HYCLATE 100 MG PO TABS
100.0000 mg | ORAL_TABLET | Freq: Two times a day (BID) | ORAL | Status: DC
  Administered 2015-02-14 – 2015-02-15 (×2): 100 mg via ORAL
  Filled 2015-02-14 (×2): qty 1

## 2015-02-14 MED ORDER — VANCOMYCIN HCL IN DEXTROSE 1-5 GM/200ML-% IV SOLN
1000.0000 mg | Freq: Three times a day (TID) | INTRAVENOUS | Status: DC
  Administered 2015-02-14: 1000 mg via INTRAVENOUS
  Filled 2015-02-14 (×3): qty 200

## 2015-02-14 NOTE — Progress Notes (Signed)
CC: Right leg abscess and cellulitis Subjective: Patient reports slow improvement but still having pain no fevers or chills.  Objective: Vital signs in last 24 hours: Temp:  [97.8 F (36.6 C)-98.3 F (36.8 C)] 98.2 F (36.8 C) (11/04 0825) Pulse Rate:  [70-87] 79 (11/04 0825) Resp:  [16-18] 18 (11/04 0825) BP: (124-134)/(58-64) 134/64 mmHg (11/04 0825) SpO2:  [94 %-95 %] 95 % (11/04 0825) Last BM Date: 02/13/15  Intake/Output from previous day: 11/03 0701 - 11/04 0700 In: 720 [P.O.:720] Out: 0  Intake/Output this shift:    Physical exam:  Less erythema less edema no purulence expressible not sure if packing had been placed as dressing was removed prior to my arrival. On tender calf  Lab Results: CBC   Recent Labs  02/12/15 0539 02/14/15 0456  WBC 10.1 6.6  HGB 11.9* 10.9*  HCT 33.6* 31.6*  PLT 161 211   BMET  Recent Labs  02/12/15 0539 02/14/15 0456  NA 135 136  K 3.6 3.7  CL 100* 102  CO2 26 28  GLUCOSE 133* 128*  BUN 15 13  CREATININE 1.05 0.86  CALCIUM 8.4* 8.3*   PT/INR No results for input(s): LABPROT, INR in the last 72 hours. ABG No results for input(s): PHART, HCO3 in the last 72 hours.  Invalid input(s): PCO2, PO2  Studies/Results: No results found.  Anti-infectives: Anti-infectives    Start     Dose/Rate Route Frequency Ordered Stop   02/14/15 0800  vancomycin (VANCOCIN) IVPB 1000 mg/200 mL premix     1,000 mg 200 mL/hr over 60 Minutes Intravenous Every 8 hours 02/14/15 0359     02/12/15 1100  vancomycin (VANCOCIN) IVPB 1000 mg/200 mL premix  Status:  Discontinued     1,000 mg 200 mL/hr over 60 Minutes Intravenous Every 12 hours 02/11/15 1708 02/14/15 0359   02/11/15 2300  vancomycin (VANCOCIN) IVPB 1000 mg/200 mL premix     1,000 mg 200 mL/hr over 60 Minutes Intravenous  Once 02/11/15 1708 02/12/15 0000   02/11/15 1530  vancomycin (VANCOCIN) IVPB 1000 mg/200 mL premix     1,000 mg 200 mL/hr over 60 Minutes Intravenous  Once  02/11/15 1522 02/11/15 1642      Assessment/Plan:  Slow improvement on IV antibiotics no drainable purulence at this point overall the leg is appearing much improved. Recommend continuing IV antibiotics possibly home tomorrow  Florene Glen, MD, FACS  02/14/2015

## 2015-02-14 NOTE — Progress Notes (Signed)
Pt is alert and oriented x 4, on room air, continues on iso precautions for MRSA, wound care performed on right leg, c/o moderate pain but is hesitant to take pain medication, oxy given prior to wound care. Good appetite, up to bathroom, Dr. Burt Knack evaluated wound and will likely leg patient go home on 11/5. Continues on IV antibiotics.

## 2015-02-14 NOTE — Progress Notes (Addendum)
[place] [facility] [place] NAME: Walter Mosley    MR#:  [id number]  DATE OF BIRTH:  1940/05/28  SUBJECTIVE:  CHIEF COMPLAINT:   Chief Complaint  Patient presents with  . Recurrent Skin Infections  less pain, feels some better than y'day but not quite wanting to go home, would like to stay one more night  REVIEW OF SYSTEMS:  Review of Systems  Constitutional: Negative for fever, weight loss, malaise/fatigue and diaphoresis.  HENT: Negative for ear discharge, ear pain, hearing loss, nosebleeds, sore throat and tinnitus.   Eyes: Negative for blurred vision and pain.  Respiratory: Negative for cough, hemoptysis, shortness of breath and wheezing.   Cardiovascular: Negative for chest pain, palpitations, orthopnea and leg swelling.  Gastrointestinal: Negative for heartburn, nausea, vomiting, abdominal pain, diarrhea, constipation and blood in stool.  Genitourinary: Negative for dysuria, urgency and frequency.  Musculoskeletal: Negative for myalgias and back pain.  Skin: Positive for rash. Negative for itching.  Neurological: Negative for dizziness, tingling, tremors, focal weakness, seizures, weakness and headaches.  Psychiatric/Behavioral: Negative for depression. The patient is not nervous/anxious.    DRUG ALLERGIES:  No Known Allergies VITALS:  Blood pressure 122/54, pulse 61, temperature 98 F (36.7 C), temperature source Oral, resp. rate 18, height 6\' 4"  (1.93 m), weight 92.987 kg (205 lb), SpO2 94 %. PHYSICAL EXAMINATION:  Physical Exam  Constitutional: He is oriented to person, place, and time and well-developed, well-nourished, and in no distress.  HENT:  Head: Normocephalic and atraumatic.  Eyes: Conjunctivae and EOM are normal. Pupils are equal, round, and reactive to light.  Neck: Normal range of motion. Neck supple. No tracheal deviation present. No thyromegaly present.  Cardiovascular: Normal rate, regular rhythm and normal heart  sounds.   Pulmonary/Chest: Effort normal and breath sounds normal. No respiratory distress. He has no wheezes. He exhibits no tenderness.  Abdominal: Soft. Bowel sounds are normal. He exhibits no distension. There is no tenderness.  Musculoskeletal: Normal range of motion.  Neurological: He is alert and oriented to person, place, and time. No cranial nerve deficit.  Skin: Skin is warm and dry. Lesion and rash noted. Rash is pustular. There is erythema.     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 I&D site with iodoform packing present   Psychiatric: Mood and affect normal.   LABORATORY PANEL:   CBC  Recent Labs Lab 02/14/15 0456  WBC 6.6  HGB 10.9*  HCT 31.6*  PLT 211   ------------------------------------------------------------------------------------------------------------------ Chemistries   Recent Labs Lab 02/11/15 1417  02/14/15 0456  NA 133*  < > 136  K 3.6  < > 3.7  CL 96*  < > 102  CO2 27  < > 28  GLUCOSE 164*  < > 128*  BUN 15  < > 13  CREATININE 1.20  < > 0.86  CALCIUM 8.8*  < > 8.3*  AST 24  --   --   ALT 25  --   --   ALKPHOS 66  --   --   BILITOT 0.9  --   --   < > = values in this interval not displayed. RADIOLOGY:  No results found. ASSESSMENT AND PLAN:   * Cellulitis and abscess: Status post incision and drainage Patient has history of MRSA, he failed Bactrim therapy as outpatient. Change to IV vanco to PO doxy. Looks some better than before Cultures growing MRSA. Appreciate surgical input - recommends dressing changes for  now and likely home tomorrow.  * Paroxysmal atrial fibrillation Rate is under control continue Xarelto.  * Hypertension Blood pressure under control continue home medications  * Coronary artery disease Continue home medications.     All the records are reviewed and case discussed with Care Management/Social Worker. Management plans discussed with the patient, family and they  are in agreement.  CODE STATUS: Full code  TOTAL TIME TAKING CARE OF THIS PATIENT: 35 minutes.   More than 50% of the time was spent in counseling/coordination of care: YES  POSSIBLE D/C IN am, DEPENDING ON CLINICAL CONDITION and surgical input.   [facility], Crystal Scarberry M.D on 02/14/2015 at 4:14 PM  Between 7am to 6pm - Pager - [phone]  After 6pm go to [email or web address] - password [email or web address] [place]  Office  [phone]  CC: Primary care physician; Theotis Burrow, MD

## 2015-02-15 MED ORDER — DOXYCYCLINE HYCLATE 100 MG PO TABS: 100.0000 mg | ORAL_TABLET | Freq: Two times a day (BID) | ORAL | Status: AC

## 2015-02-15 MED ORDER — OXYCODONE-ACETAMINOPHEN 5-325 MG PO TABS: 1.0000 | ORAL_TABLET | Freq: Four times a day (QID) | ORAL | Status: DC | PRN

## 2015-02-15 NOTE — Progress Notes (Signed)
Pt discharged home via wheelchair. Pt will drive himself home. No pain medication given prior to discharge this am. Dsg changed to RLE. Scant drainage. RLE less redness noted. Pt states RLE not as painful. Pt escorted to car via nursing staff.

## 2015-02-15 NOTE — Discharge Summary (Signed)
[facility] [place] NAME: Walter Mosley    MR#:  [id number]  DATE OF BIRTH:  June 07, 1940  DATE OF ADMISSION:  02/11/2015 ADMITTING PHYSICIAN: Vaughan Basta, MD  DATE OF DISCHARGE: 02/15/15  PRIMARY CARE PHYSICIAN: Theotis Burrow, MD    ADMISSION DIAGNOSIS:  Abscess [L02.91] Cellulitis of right lower extremity [S96.283]  DISCHARGE DIAGNOSIS:  Principal Problem:   Cellulitis Active Problems:   Abscess   SECONDARY DIAGNOSIS:   Past Medical History  Diagnosis Date  . Hyperlipidemia     Mixed  . GERD (gastroesophageal reflux disease)   . Osteoarthritis, knee   . Coronary artery disease     Artery bypass graft  . Atrial flutter ([place])     s/p ablation  . PSVT (paroxysmal supraventricular tachycardia) ([place])   . PAT (paroxysmal atrial tachycardia) ([place])   . Hypertension     Unspecified  . 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 Cancer ([place])     squamous cell carcimnoma right leg    HOSPITAL COURSE:   74 year old male with past medical history significant for hypertension, coronary artery disease and paroxysmal atrial fibrillation admitted to the hospital secondary to leg cellulitis and abscess, failed outpatient treatment.  #1 right leg cellulitis with abscess-denies any trauma, started as a blister -Cultures growing MRSA. -Incision and drainage done, has packing with no active drainage at this time. -Was on IV vancomycin here, change to oral doxycycline at this time with clinical improvement. -Outpatient follow-up  #2 paroxysmal atrial fibrillation-rate is controlled. -On atenolol. Continue Xarelto for anticoagulation  #3 hypertension-continue home medications. -Patient is on Norvasc, atenolol, hydrochlorothiazide  Patient is stable, ambulating. Being discharged home today  DISCHARGE CONDITIONS:   stable  CONSULTS OBTAINED:  Treatment Team:  Florene Glen, MD  DRUG  ALLERGIES:  No Known Allergies  DISCHARGE MEDICATIONS:   Current Discharge Medication List    START taking these medications   Details  doxycycline (VIBRA-TABS) 100 MG tablet Take 1 tablet (100 mg total) by mouth every 12 (twelve) hours. Qty: 16 tablet, Refills: 0    oxyCODONE-acetaminophen (PERCOCET/ROXICET) 5-325 MG tablet Take 1 tablet by mouth every 6 (six) hours as needed for moderate pain or severe pain. Qty: 20 tablet, Refills: 0      CONTINUE these medications which have NOT CHANGED   Details  amLODipine (NORVASC) 10 MG tablet Take 10 mg by mouth daily.     atenolol (TENORMIN) 50 MG tablet Take 50 mg by mouth daily.     atorvastatin (LIPITOR) 80 MG tablet Take 80 mg by mouth every evening.     ezetimibe (ZETIA) 10 MG tablet Take 1 tablet (10 mg total) by mouth daily. Qty: 30 tablet, Refills: 5   Associated Diagnoses: Hyperlipemia    hydrochlorothiazide (HYDRODIURIL) 25 MG tablet Take 25 mg by mouth daily.     mupirocin ointment (BACTROBAN) 2 % Apply 1 application topically 3 (three) times daily. Qty: 22 g, Refills: 0    Omega-3 Fatty Acids (FISH OIL) 1200 MG CAPS Take 1,200 mg by mouth 2 (two) times daily.    potassium chloride (K-DUR) 10 MEQ tablet Take 20 mEq by mouth every evening.     rivaroxaban (XARELTO) 20 MG TABS tablet Take 1 tablet (20 mg total) by mouth daily with supper. Qty: 30 tablet, Refills: 6   Associated Diagnoses: Chronic atrial fibrillation (HCC)      STOP taking these medications     sulfamethoxazole-trimethoprim (BACTRIM DS,SEPTRA  DS) 800-160 MG tablet          DISCHARGE INSTRUCTIONS:   1. PCP follow-up in 1-2 weeks   If you experience worsening of your admission symptoms, develop shortness of breath, life threatening emergency, suicidal or homicidal thoughts you must seek medical attention immediately by calling 911 or calling your MD immediately  if symptoms less severe.  You Must read complete instructions/literature along with  all the possible adverse reactions/side effects for all the Medicines you take and that have been prescribed to you. Take any new Medicines after you have completely understood and accept all the possible adverse reactions/side effects.   Please note  You were cared for by a hospitalist during your hospital stay. If you have any questions about your discharge medications or the care you received while you were in the hospital after you are discharged, you can call the unit and asked to speak with the hospitalist on call if the hospitalist that took care of you is not available. Once you are discharged, your primary care physician will handle any further medical issues. Please note that NO REFILLS for any discharge medications will be authorized once you are discharged, as it is imperative that you return to your primary care physician (or establish a relationship with a primary care physician if you do not have one) for your aftercare needs so that they can reassess your need for medications and monitor your lab values.    Today   CHIEF COMPLAINT:   Chief Complaint  Patient presents with  . Recurrent Skin Infections    VITAL SIGNS:  Blood pressure 129/62, pulse 81, temperature 98.5 F (36.9 C), temperature source Oral, resp. rate 18, height 6\' 4"  (1.93 m), weight 92.987 kg (205 lb), SpO2 93 %.  I/O:   Intake/Output Summary (Last 24 hours) at 02/15/15 0832 Last data filed at 02/14/15 1400  Gross per 24 hour  Intake    200 ml  Output      0 ml  Net    200 ml    PHYSICAL EXAMINATION:   Physical Exam  GENERAL:  74 y.o.-year-old patient lying in the bed with no acute distress.  EYES: Pupils equal, round, reactive to light and accommodation. No scleral icterus. Extraocular muscles intact.  HEENT: Head atraumatic, normocephalic. Oropharynx and nasopharynx clear.  NECK:  Supple, no jugular venous distention. No thyroid enlargement, no tenderness.  LUNGS: Normal breath sounds  bilaterally, no wheezing, rales,rhonchi or crepitation. No use of accessory muscles of respiration.  CARDIOVASCULAR: S1, S2 normal. No murmurs, rubs, or gallops.  ABDOMEN: Soft, non-tender, non-distended. Bowel sounds present. No organomegaly or mass.  EXTREMITIES: Anterior portion of the right leg has erythema around the shin, there is an old scab from skin cancer that was resected in the past. Just beside it, on the shin there is an open wound with packing in it. No pus discharge noted. No pedal edema, cyanosis, or clubbing.  NEUROLOGIC: Cranial nerves II through XII are intact. Muscle strength 5/5 in all extremities. Sensation intact. Gait not checked.  PSYCHIATRIC: The patient is alert and oriented x 3.  SKIN: No obvious rash, lesion, or ulcer.   DATA REVIEW:   CBC  Recent Labs Lab 02/14/15 0456  WBC 6.6  HGB 10.9*  HCT 31.6*  PLT 211    Chemistries   Recent Labs Lab 02/11/15 1417  02/14/15 0456  NA 133*  < > 136  K 3.6  < > 3.7  CL 96*  < >  102  CO2 27  < > 28  GLUCOSE 164*  < > 128*  BUN 15  < > 13  CREATININE 1.20  < > 0.86  CALCIUM 8.8*  < > 8.3*  AST 24  --   --   ALT 25  --   --   ALKPHOS 66  --   --   BILITOT 0.9  --   --   < > = values in this interval not displayed.  Cardiac Enzymes No results for input(s): TROPONINI in the last 168 hours.  Microbiology Results  Results for orders placed or performed during the hospital encounter of 02/11/15  Culture, blood (routine x 2)     Status: None (Preliminary result)   Collection Time: 02/11/15  2:17 PM  Result Value Ref Range Status   Specimen Description BLOOD RIGHT ASSIST CONTROL  Final   Special Requests BOTTLES DRAWN AEROBIC AND ANAEROBIC 6CC  Final   Culture NO GROWTH 4 DAYS  Final   Report Status PENDING  Incomplete  Culture, blood (routine x 2)     Status: None (Preliminary result)   Collection Time: 02/11/15  3:20 PM  Result Value Ref Range Status   Specimen Description BLOOD LEFT ASSIST CONTROL   Final   Special Requests BOTTLES DRAWN AEROBIC AND ANAEROBIC 5CC  Final   Culture NO GROWTH 4 DAYS  Final   Report Status PENDING  Incomplete  Wound culture     Status: None   Collection Time: 02/11/15  3:43 PM  Result Value Ref Range Status   Specimen Description ABSCESS  Final   Special Requests NONE  Final   Gram Stain   Final    MODERATE WBC SEEN FEW GRAM NEGATIVE RODS RARE GRAM POSITIVE COCCI    Culture   Final    LIGHT GROWTH METHICILLIN RESISTANT STAPHYLOCOCCUS AUREUS CRITICAL RESULT CALLED TO, READ BACK BY AND VERIFIED WITH: RN ANNA RODRIGUEZ 02/13/15 0957AM    Report Status 02/13/2015 FINAL  Final   Organism ID, Bacteria METHICILLIN RESISTANT STAPHYLOCOCCUS AUREUS  Final      Susceptibility   Methicillin resistant staphylococcus aureus - MIC*    CIPROFLOXACIN 4 RESISTANT Resistant     GENTAMICIN <=0.5 SENSITIVE Sensitive     OXACILLIN >=4 RESISTANT Resistant     VANCOMYCIN 1 SENSITIVE Sensitive     TRIMETH/SULFA <=10 SENSITIVE Sensitive     CEFOXITIN SCREEN Value in next row Resistant      POSITIVECEFOXITIN SCREEN - This test may be used to predict mecA-mediated oxacillin resistance, and it is based on the cefoxitin disk screen test.  The cefoxitin screen and oxacillin work in combination to determine the final interpretation reported for oxacillin.     Inducible Clindamycin Value in next row Sensitive      POSITIVECEFOXITIN SCREEN - This test may be used to predict mecA-mediated oxacillin resistance, and it is based on the cefoxitin disk screen test.  The cefoxitin screen and oxacillin work in combination to determine the final interpretation reported for oxacillin.     ERYTHROMYCIN Value in next row Resistant      RESISTANT>=8    TETRACYCLINE Value in next row Sensitive      SENSITIVE<=1    LINEZOLID Value in next row Sensitive      SENSITIVE2    LEVOFLOXACIN Value in next row Intermediate      INTERMEDIATE4    * LIGHT GROWTH METHICILLIN RESISTANT STAPHYLOCOCCUS  AUREUS    RADIOLOGY:  No results found.  EKG:  Orders placed or performed in visit on 01/16/15  . EKG 12-Lead      Management plans discussed with the patient, family and they are in agreement.  CODE STATUS:     Code Status Orders        Start     Ordered   02/11/15 1821  Full code   Continuous     02/11/15 1820      TOTAL TIME TAKING CARE OF THIS PATIENT: 38 minutes.    Gladstone Lighter M.D on 02/15/2015 at 8:32 AM  Between 7am to 6pm - Pager - [phone]  After 6pm go to [email or web address] - password [email or web address] [facility]  Office  [phone]  CC: Primary care physician; Theotis Burrow, MD

## 2015-02-16 LAB — CULTURE, BLOOD (ROUTINE X 2)
CULTURE: NO GROWTH
Culture: NO GROWTH

## 2015-02-17 ENCOUNTER — Telehealth: Payer: Self-pay

## 2015-02-17 NOTE — Telephone Encounter (Signed)
Looking into documentation to make post-discharge call. Patient has been informed to follow-up with PCP for any further problems. Call was not made to patient based on this information.

## 2015-02-20 ENCOUNTER — Ambulatory Visit (INDEPENDENT_AMBULATORY_CARE_PROVIDER_SITE_OTHER): Payer: Medicare Other | Admitting: Surgery

## 2015-02-20 ENCOUNTER — Encounter: Payer: Self-pay | Admitting: Surgery

## 2015-02-20 VITALS — BP 129/65 | HR 72 | Temp 98.2°F | Ht 76.0 in | Wt 206.0 lb

## 2015-02-20 DIAGNOSIS — L03115 Cellulitis of right lower limb: Secondary | ICD-10-CM

## 2015-02-20 DIAGNOSIS — L0291 Cutaneous abscess, unspecified: Secondary | ICD-10-CM | POA: Diagnosis not present

## 2015-02-20 MED ORDER — SULFAMETHOXAZOLE-TRIMETHOPRIM 800-160 MG PO TABS: 1.0000 | ORAL_TABLET | Freq: Two times a day (BID) | ORAL | Status: DC

## 2015-02-20 NOTE — Patient Instructions (Signed)
Follow up at your appointment on 02/25/15 at 1015 in the [facility] office.

## 2015-02-20 NOTE — Progress Notes (Signed)
74 yr old male with RLE wound.  He was seen in the hospital last week for abscess to RLE with MRSA infection.  He states he and grandson who is travel nurse packing.  Pain better but still some with packing.   Filed Vitals:   02/20/15 1006  BP: 129/65  Pulse: 72  Temp: 98.2 F (36.8 C)   PE:  Gen: NAD RLE: small wound packing, small amount of cellulitis lateral  A/P:  MRSA + in wound, changed antibiotic to Bactrim, continue wound care.  WIll have f/u in 1 week for wound check.

## 2015-02-25 ENCOUNTER — Ambulatory Visit (INDEPENDENT_AMBULATORY_CARE_PROVIDER_SITE_OTHER): Payer: Medicare Other | Admitting: General Surgery

## 2015-02-25 ENCOUNTER — Encounter: Payer: Self-pay | Admitting: General Surgery

## 2015-02-25 VITALS — BP 121/51 | HR 70 | Temp 97.8°F | Ht 76.0 in | Wt 206.0 lb

## 2015-02-25 DIAGNOSIS — Z4889 Encounter for other specified surgical aftercare: Secondary | ICD-10-CM

## 2015-02-25 NOTE — Patient Instructions (Signed)
Please finish your course of antibiotics.  If you see that it is getting red, warm at touch or draining a lot, please give [place] a call. Use Eucerin on your leg to help with your dryness.

## 2015-02-25 NOTE — Progress Notes (Signed)
Outpatient Surgical Follow Up  02/25/2015  Walter Reno Shellie Berenguer. is an 74 y.o. male.   Chief Complaint  Patient presents with  . Follow-up    Cellulitis of right lower extremity    HPI:  74 year old male returns to clinic for follow-up of an I&D to his right lower extremity. Patient states that it is much improved from his prior visit. Has had minimal drainage from the area and the redness has completely resolved. Denies any fevers, chills, nausea, vomiting, diarrhea, constipation, chest pain, shortness breath. Doing well.  Past Medical History  Diagnosis Date  . Hyperlipidemia     Mixed  . GERD (gastroesophageal reflux disease)   . Osteoarthritis, knee   . Coronary artery disease     Artery bypass graft  . Atrial flutter ([place])     s/p ablation  . PSVT (paroxysmal supraventricular tachycardia) ([place])   . PAT (paroxysmal atrial tachycardia) ([place])   . Hypertension     Unspecified  . 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 Cancer ([place])     squamous cell carcimnoma right leg    Past Surgical History  Procedure Laterality Date  . Hemorrhoid surgery    . Coronary artery bypass graft  1993    CABG x 5 @ [facility]  . Cardiac electrophysiology study and ablation    . Cardiac catheterization  1993  . Mohs surgery      Family History  Problem Relation Age of Onset  . Coronary artery disease Other   . Heart disease Father   . Heart attack Father 7    MI    Social History:  reports that he has never smoked. He has never used smokeless tobacco. He reports that he does not drink alcohol or use illicit drugs.  Allergies: No Known Allergies  Medications reviewed.    ROS  a multisystem review of systems was completed. All pertinent positives and negatives within the history of present illness the remainder were negative.   BP 121/51 mmHg  Pulse 70  Temp(Src) 97.8 F (36.6 C) (Oral)  Ht 6\' 4"  (1.93 m)  Wt 93.441 kg (206 lb)  BMI 25.09 kg/m2  Physical  Exam  Gen.: No acute distress  chest: Clear to auscultation Heart: Regular rhythm Abdomen: Soft, nontender, nondistended Skin: 5 mm incision sites without any erythema or purulent drainage. No evidence of abscess on exam today. Dry skin surrounding the prior abscess site.   No results found for this or any previous visit (from the past 48 hour(s)). No results found.  Assessment/Plan:  1. Aftercare following surgery  74 year old male status post incision and drainage of right lower extremity abscess. Doing well. Discussed importance of completing his course of antibiotics. Also discussed local wound care to keep the area covered until all drainage stops. Discussed the use of Eucerin cream for the dry skin around the site. Signs and symptoms of recurrence of the infection were reviewed patient instructed to return to clinic medially should they occur. Otherwise he'll follow up on an as-needed basis.     Clayburn Pert  02/25/2015,negative

## 2015-03-03 ENCOUNTER — Ambulatory Visit: Payer: Medicare Other | Admitting: Surgery

## 2015-03-05 ENCOUNTER — Telehealth: Payer: Self-pay | Admitting: Cardiovascular Disease

## 2015-03-05 DIAGNOSIS — I482 Chronic atrial fibrillation, unspecified: Secondary | ICD-10-CM

## 2015-03-05 MED ORDER — RIVAROXABAN 20 MG PO TABS: 20.0000 mg | ORAL_TABLET | Freq: Every day | ORAL | Status: DC

## 2015-03-05 NOTE — Telephone Encounter (Signed)
[facility] [phone]  *STAT* If patient is at the pharmacy, call can be transferred to refill team.   1. Which medications need to be refilled? (please list name of each medication and dose if known) Xarelto 20 mg po with supper  2. Which pharmacy/location (including street and city if local pharmacy) is medication to be sent to? [place] [place]   3. Do they need a 30 day or 90 day supply? 90 please !!! Is same price as 30 day

## 2015-03-05 NOTE — Telephone Encounter (Signed)
Pt's Rx was sent to pr's pharmacy as requested. Confirmation received.

## 2015-03-26 ENCOUNTER — Other Ambulatory Visit: Payer: Medicare Other

## 2015-03-31 ENCOUNTER — Other Ambulatory Visit: Payer: Medicare Other

## 2015-03-31 ENCOUNTER — Other Ambulatory Visit (INDEPENDENT_AMBULATORY_CARE_PROVIDER_SITE_OTHER): Payer: Medicare Other | Admitting: *Deleted

## 2015-03-31 DIAGNOSIS — E785 Hyperlipidemia, unspecified: Secondary | ICD-10-CM | POA: Diagnosis not present

## 2015-04-01 LAB — LIPID PANEL
CHOL/HDL RATIO: 3.9 ratio (ref 0.0–5.0)
Cholesterol, Total: 128 mg/dL (ref 100–199)
HDL: 33 mg/dL — AB (ref >39)
LDL CALC: 77 mg/dL (ref 0–99)
Triglycerides: 92 mg/dL (ref 0–149)
VLDL CHOLESTEROL CAL: 18 mg/dL (ref 5–40)

## 2015-04-01 LAB — HEPATIC FUNCTION PANEL
ALT: 30 IU/L (ref 0–44)
AST: 25 IU/L (ref 0–40)
Albumin: 4.4 g/dL (ref 3.5–4.8)
Alkaline Phosphatase: 54 IU/L (ref 39–117)
Bilirubin Total: 0.6 mg/dL (ref 0.0–1.2)
Bilirubin, Direct: 0.18 mg/dL (ref 0.00–0.40)
TOTAL PROTEIN: 6.6 g/dL (ref 6.0–8.5)

## 2015-10-23 ENCOUNTER — Ambulatory Visit (INDEPENDENT_AMBULATORY_CARE_PROVIDER_SITE_OTHER): Payer: Medicare Other | Admitting: Cardiovascular Disease

## 2015-10-23 ENCOUNTER — Encounter: Payer: Self-pay | Admitting: Cardiovascular Disease

## 2015-10-23 VITALS — BP 112/70 | HR 58 | Ht 76.0 in | Wt 205.5 lb

## 2015-10-23 DIAGNOSIS — I482 Chronic atrial fibrillation, unspecified: Secondary | ICD-10-CM

## 2015-10-23 NOTE — Progress Notes (Signed)
Cardiology Office Note   Date:  10/23/2015   ID:  Walter Mosley., DOB Sep 03, 1940, MRN [id number]  PCP:  Elyse Jarvis, MD  Cardiologist:   Kathlyn Sacramento, MD   Chief Complaint  Patient presents with  . other    6 month f/u c/o nose and gum bleeding weekly. Meds reviewed verbally with pt.      History of Present Illness: Walter Karwowski. is a 75 y.o. male who presents for a followup visit regarding coronary artery disease and chronic atrial fibrillation . He has a long history of coronary disease s/p bypass surgery in 1993. No ischemic cardiac events since then. He had atrial flutter in 2010  and underwent successful ablation by Dr. Lovena Le.   He had episodes of symptomatic bradycardia in 2014.  This improved after stopping Diltiazem . Echocardiogram in 11/2012  showed normal LV systolic function, mild left ventricular hypertrophy and mildly dilated left atrium. He was noted to be bradycardic during the echocardiogram with heart rates ranging from 45-52 beats per minute. Holter monitor showed sinus rhythm with evidence of paroxysmal atrial fibrillation with no atrial flutter. He did have sinus bradycardia and 2 pauses less than 3 seconds in duration during sleeping hours. Average heart rate was 58 beats per minute. He was started on Xarelto for anticoagulation.   During last visit, I increased the dose of Xarelto to 20 mg once daily. He complains of intermittent bleeding from his gums from the same spot. He has not seen a dentist in more than a year. He was hospitalized in November for cellulitis and he had I&D. He denies any chest pain or shortness of breath. No palpitations or dizziness. He has been taking his medications regularly.    Past Medical History  Diagnosis Date  . Hyperlipidemia     Mixed  . GERD (gastroesophageal reflux disease)   . Osteoarthritis, knee   . Coronary artery disease     Artery bypass graft  . Atrial flutter ([place])     s/p ablation  . PSVT (paroxysmal  supraventricular tachycardia) ([place])   . PAT (paroxysmal atrial tachycardia) ([place])   . Hypertension     Unspecified  . 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 Cancer ([place])     squamous cell carcimnoma right leg    Past Surgical History  Procedure Laterality Date  . Hemorrhoid surgery    . Coronary artery bypass graft  1993    CABG x 5 @ [facility]  . Cardiac electrophysiology study and ablation    . Cardiac catheterization  1993  . Mohs surgery       Current Outpatient Prescriptions  Medication Sig Dispense Refill  . amLODipine (NORVASC) 10 MG tablet Take 10 mg by mouth daily.     [name] atenolol (TENORMIN) 50 MG tablet Take 50 mg by mouth daily.     [name] atorvastatin (LIPITOR) 80 MG tablet Take 80 mg by mouth every evening.     . ezetimibe (ZETIA) 10 MG tablet Take 1 tablet (10 mg total) by mouth daily. 30 tablet 5  . hydrochlorothiazide (HYDRODIURIL) 25 MG tablet Take 25 mg by mouth daily.     . Omega-3 Fatty Acids (FISH OIL) 1200 MG CAPS Take 1,200 mg by mouth 2 (two) times daily.    . potassium chloride (K-DUR) 10 MEQ tablet Take 20 mEq by mouth every evening.     . rivaroxaban (XARELTO) 20 MG TABS tablet Take 1 tablet (20 mg  total) by mouth daily with supper. 90 tablet 1   No current facility-administered medications for this visit.    Allergies:   Review of patient's allergies indicates no known allergies.    Social History:  The patient  reports that he has never smoked. He has never used smokeless tobacco. He reports that he does not drink alcohol or use illicit drugs.   Family History:  The patient's family history includes Coronary artery disease in his other; Heart attack (age of onset: 46) in his father; Heart disease in his father.    ROS:  Please see the history of present illness.   Otherwise, review of systems are positive for none.   All other systems are reviewed and negative.    PHYSICAL EXAM: VS:  BP 112/70 mmHg  Pulse 58  Ht 6\' 4"  (1.93 m)   Wt 205 lb 8 oz (93.214 kg)  BMI 25.02 kg/m2 , BMI Body mass index is 25.02 kg/(m^2). GEN: Well nourished, well developed, in no acute distress HEENT: normal Neck: no JVD, carotid bruits, or masses Cardiac: Irregularly irregular; no murmurs, rubs, or gallops,no edema  Respiratory:  clear to auscultation bilaterally, normal work of breathing GI: soft, nontender, nondistended, + BS MS: no deformity or atrophy Skin: warm and dry, no rash Neuro:  Strength and sensation are intact Psych: euthymic mood, full affect   EKG:  EKG is ordered today. The ekg ordered today demonstrates atrial fibrillation with ventricular rate of 58 bpm.   Recent Labs: 02/14/2015: BUN 13; Creatinine, Ser 0.86; Hemoglobin 10.9*; Platelets 211; Potassium 3.7; Sodium 136 03/31/2015: ALT 30    Lipid Panel    Component Value Date/Time   CHOL 128 03/31/2015 0832   CHOL 179 06/24/2008 1202   TRIG 92 03/31/2015 0832   HDL 33* 03/31/2015 0832   HDL 30.0* 06/24/2008 1202   CHOLHDL 3.9 03/31/2015 0832   CHOLHDL 6.0 CALC 06/24/2008 1202   VLDL 30 06/24/2008 1202   LDLCALC 77 03/31/2015 0832   LDLCALC 119* 06/24/2008 1202      Wt Readings from Last 3 Encounters:  10/23/15 205 lb 8 oz (93.214 kg)  02/25/15 206 lb (93.441 kg)  02/20/15 206 lb (93.441 kg)        ASSESSMENT AND PLAN:  1.  Chronic atrial fibrillation: He is overall asymptomatic. Ventricular rate is well controlled on current dose of atenolol. Continue anticoagulation with Xarelto. I advised him to schedule an appointment with his dentist to see why he is having bleeding from his gums. If this continues to be an issue, I might consider switching him to Eliquis to see if there is a difference.  2. Coronary artery disease involving native coronary arteries without angina: He is overall doing well. Continue medical therapy.  3. Essential hypertension: Blood pressure is controlled on current medications.  4. Hyperlipidemia: Lipid profile improved  after the addition of Zetia. He is also on maximal dose atorvastatin. Most recent LDL was 77.  5. Anemia: This happened around his hospitalization in November. His hemoglobin was 10.9 with normal MCV. I requested repeat CBC. If he is still anemic, I advised him to follow-up with his primary care physician for further evaluation and possible referral to gastroenterology.    Disposition:   FU with me in 6 months  Signed,  Kathlyn Sacramento, MD  10/23/2015 8:27 AM    [place]

## 2015-10-23 NOTE — Patient Instructions (Signed)
Medication Instructions:  Your physician recommends that you continue on your current medications as directed. Please refer to the Current Medication list given to you today.   Labwork: CBC  Testing/Procedures: none  Follow-Up: Your physician wants you to follow-up in: 6 months with Dr. Fletcher Anon.  You will receive a reminder letter in the mail two months in advance. If you don't receive a letter, please call our office to schedule the follow-up appointment.   Any Other Special Instructions Will Be Listed Below (If Applicable).     If you need a refill on your cardiac medications before your next appointment, please call your pharmacy.

## 2015-10-24 LAB — CBC
HEMATOCRIT: 40.5 % (ref 37.5–51.0)
Hemoglobin: 13.6 g/dL (ref 12.6–17.7)
MCH: 29.1 pg (ref 26.6–33.0)
MCHC: 33.6 g/dL (ref 31.5–35.7)
MCV: 87 fL (ref 79–97)
Platelets: 176 10*3/uL (ref 150–379)
RBC: 4.68 x10E6/uL (ref 4.14–5.80)
RDW: 14.8 % (ref 12.3–15.4)
WBC: 5.4 10*3/uL (ref 3.4–10.8)

## 2015-12-02 ENCOUNTER — Encounter: Payer: Self-pay | Admitting: Emergency Medicine

## 2015-12-02 ENCOUNTER — Emergency Department
Admission: EM | Admit: 2015-12-02 | Discharge: 2015-12-02 | Disposition: A | Discharge status: Home / Self Care | Payer: Medicare Other | Attending: Emergency Medicine | Admitting: Emergency Medicine

## 2015-12-02 DIAGNOSIS — I10 Essential (primary) hypertension: Secondary | ICD-10-CM | POA: Insufficient documentation

## 2015-12-02 DIAGNOSIS — Z85828 Personal history of other malignant neoplasm of skin: Secondary | ICD-10-CM | POA: Diagnosis not present

## 2015-12-02 DIAGNOSIS — Z79899 Other long term (current) drug therapy: Secondary | ICD-10-CM | POA: Diagnosis not present

## 2015-12-02 DIAGNOSIS — R040 Epistaxis: Secondary | ICD-10-CM | POA: Insufficient documentation

## 2015-12-02 DIAGNOSIS — I2510 Atherosclerotic heart disease of native coronary artery without angina pectoris: Secondary | ICD-10-CM | POA: Insufficient documentation

## 2015-12-02 LAB — COMPREHENSIVE METABOLIC PANEL
ALBUMIN: 4.4 g/dL (ref 3.5–5.0)
ALK PHOS: 53 U/L (ref 38–126)
ALT: 20 U/L (ref 17–63)
AST: 24 U/L (ref 15–41)
Anion gap: 6 (ref 5–15)
BUN: 16 mg/dL (ref 6–20)
CALCIUM: 9 mg/dL (ref 8.9–10.3)
CO2: 28 mmol/L (ref 22–32)
CREATININE: 0.99 mg/dL (ref 0.61–1.24)
Chloride: 102 mmol/L (ref 101–111)
Glucose, Bld: 202 mg/dL — ABNORMAL HIGH (ref 65–99)
Potassium: 3.2 mmol/L — ABNORMAL LOW (ref 3.5–5.1)
Sodium: 136 mmol/L (ref 135–145)
Total Bilirubin: 1 mg/dL (ref 0.3–1.2)
Total Protein: 7 g/dL (ref 6.5–8.1)

## 2015-12-02 LAB — CBC WITH DIFFERENTIAL/PLATELET
BASOS ABS: 0 10*3/uL (ref 0–0.1)
BASOS PCT: 1 %
EOS PCT: 3 %
Eosinophils Absolute: 0.2 10*3/uL (ref 0–0.7)
HCT: 39.4 % — ABNORMAL LOW (ref 40.0–52.0)
Hemoglobin: 13.8 g/dL (ref 13.0–18.0)
LYMPHS PCT: 21 %
Lymphs Abs: 1.1 10*3/uL (ref 1.0–3.6)
MCH: 30.2 pg (ref 26.0–34.0)
MCHC: 35.1 g/dL (ref 32.0–36.0)
MCV: 86.1 fL (ref 80.0–100.0)
Monocytes Absolute: 0.5 10*3/uL (ref 0.2–1.0)
Monocytes Relative: 9 %
NEUTROS ABS: 3.5 10*3/uL (ref 1.4–6.5)
Neutrophils Relative %: 66 %
PLATELETS: 154 10*3/uL (ref 150–440)
RBC: 4.58 MIL/uL (ref 4.40–5.90)
RDW: 14.2 % (ref 11.5–14.5)
WBC: 5.3 10*3/uL (ref 3.8–10.6)

## 2015-12-02 LAB — PROTIME-INR
INR: 1.24
PROTHROMBIN TIME: 15.7 s — AB (ref 11.4–15.2)

## 2015-12-02 NOTE — ED Triage Notes (Signed)
Pt reports nosebleed 1 hour PTA; reports xarelto 20mg  daily. EMS reports 2 sprays of afrin in each nostril PTA with no relief.

## 2015-12-02 NOTE — ED Provider Notes (Signed)
[facility] Emergency Department Provider Note        Time seen: ----------------------------------------- 10:46 AM on 12/02/2015 -----------------------------------------    I have reviewed the triage vital signs and the nursing notes.   HISTORY  Chief Complaint No chief complaint on file.    HPI Walter Botti. is a 75 y.o. male who presents to the ER for acute epistaxis.Patient states he had a nosebleed that started 1 hour prior to arrival. He reports she's on Xarelto 20 mg daily. EMS report reports 2 sprays of Afrin in each nostril prior to arrival with no relief. Patient states when he is sitting upright bleeding is anteriorly, with nasal pressure he was having posterior bleeding down his throat. He states he has had a remote history of nosebleed in the past.   Past Medical History:  Diagnosis Date  . Atrial flutter ([place])    s/p ablation  . Cancer (HCC)    squamous cell carcimnoma right leg  . Coronary artery disease    Artery bypass graft  . GERD (gastroesophageal reflux disease)   . Hyperlipidemia    Mixed  . Hypertension    Unspecified  . 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 Osteoarthritis, knee   . PAT (paroxysmal atrial tachycardia) ([place])   . PSVT (paroxysmal supraventricular tachycardia) [facility])     Patient Active Problem List   Diagnosis Date Noted  . Abscess   . Cellulitis 02/11/2015  . Chronic atrial fibrillation ([place]) 12/31/2013  . Pre-syncope 11/16/2012  . Coronary artery disease   . Hyperlipidemia 01/29/2009  . Essential hypertension 01/29/2009  . CAD, ARTERY BYPASS GRAFT 01/29/2009  . SVT/ PSVT/ PAT 01/29/2009  . ATRIAL FLUTTER 01/29/2009    Past Surgical History:  Procedure Laterality Date  . CARDIAC CATHETERIZATION  1993  . CARDIAC ELECTROPHYSIOLOGY STUDY AND ABLATION    . CORONARY ARTERY BYPASS GRAFT  1993   CABG x 5 @ [place]  . HEMORRHOID SURGERY    . MOHS SURGERY       Allergies Review of patient's allergies indicates no known allergies.  Social History Social History  Substance Use Topics  . Smoking status: Never Smoker  . Smokeless tobacco: Never Used  . Alcohol use No    Review of Systems Constitutional: Negative for fever. ENT: Positive for epistaxis Cardiovascular: Negative for chest pain. Respiratory: Negative for shortness of breath. Gastrointestinal: Negative for abdominal pain, vomiting and diarrhea. Genitourinary: Negative for dysuria. Musculoskeletal: Negative for back pain. Skin: Negative for rash. Neurological: Negative for headaches, focal weakness or numbness.  10-point ROS otherwise negative.  ____________________________________________   PHYSICAL EXAM:  VITAL SIGNS: ED Triage Vitals  Enc Vitals Group     BP      Pulse      Resp      Temp      Temp src      SpO2      Weight      Height      Head Circumference      Peak Flow      Pain Score      Pain Loc      Pain Edu?      Excl. in [place]?     Constitutional: Alert and oriented. Mild distress Eyes: Conjunctivae are normal. PERRL. Normal extraocular movements. ENT   Head: Normocephalic and atraumatic.   Nose: Brisk anterior bleeding noted from the right nasal passage.   Mouth/Throat: Mucous membranes are moist. No active bleeding  posteriorly.   Neck: No stridor. Cardiovascular: Normal rate, regular rhythm. No murmurs, rubs, or gallops. Respiratory: Normal respiratory effort without tachypnea nor retractions. Breath sounds are clear and equal bilaterally. No wheezes/rales/rhonchi. Musculoskeletal: Nontender with normal range of motion in all extremities. No lower extremity tenderness nor edema. Neurologic:  Normal speech and language. No gross focal neurologic deficits are appreciated.  Skin:  Skin is warm, dry and intact. No rash noted. Psychiatric: Mood and affect are normal. Speech and behavior are normal.   ____________________________________________  ED COURSE:  Pertinent labs & imaging results that were available during my care of the patient were reviewed by me and considered in my medical decision making (see chart for details). Clinical Course  Patient presents to ER with epistaxis. He will require nasal packing.  Marland KitchenEpistaxis Management Date/Time: 12/02/2015 10:48 AM Performed by: Earleen Newport Authorized by: Lenise Arena E   Consent:    Consent obtained:  Verbal   Consent given by:  Patient   Risks discussed:  Bleeding Anesthesia (see MAR for exact dosages):    Anesthesia method:  None Procedure details:    Treatment site:  R anterior   Treatment method:  Merocel sponge   Treatment complexity:  Limited   Treatment episode: initial   Post-procedure details:    Assessment:  Bleeding decreased   Patient tolerance of procedure:  Tolerated well, no immediate complications   ____________________________________________   LABS (pertinent positives/negatives)  Labs Reviewed  CBC WITH DIFFERENTIAL/PLATELET - Abnormal; Notable for the following:       Result Value   HCT 39.4 (*)    All other components within normal limits  COMPREHENSIVE METABOLIC PANEL - Abnormal; Notable for the following:    Potassium 3.2 (*)    Glucose, Bld 202 (*)    All other components within normal limits  PROTIME-INR - Abnormal; Notable for the following:    Prothrombin Time 15.7 (*)    All other components within normal limits  ____________________________________________  FINAL ASSESSMENT AND PLAN  Epistaxis  Plan: Patient with labs as dictated above. Patient with good hemostatic control this time. His labs are unremarkable, I have discussed with cardiology who recommended holding a Xarelto until ENT follow-up. He is advised to return for worsening or worrisome symptoms.   Earleen Newport, MD   Note: This dictation was prepared with Dragon dictation. Any  transcriptional errors that result from this process are unintentional    Earleen Newport, MD 12/02/15 1246

## 2015-12-08 ENCOUNTER — Telehealth: Payer: Self-pay | Admitting: Cardiovascular Disease

## 2015-12-08 ENCOUNTER — Other Ambulatory Visit: Payer: Self-pay

## 2015-12-08 MED ORDER — APIXABAN 5 MG PO TABS: 5.0000 mg | ORAL_TABLET | Freq: Two times a day (BID) | ORAL | 3 refills | Status: DC

## 2015-12-08 NOTE — Telephone Encounter (Signed)
Pt presented to [facility] ER on 8/22 c/o acute epistaxis. He followed up with Dr. Tami Ribas, ENT who patient states advised him to check w/cardiology regarding lowering xarelto dosage. He had been taking xarelto 20mg  for afib but has not taken any anti-coagulant since 8/22. No more signs of bleeding at this time.  At 7/13 OV, pt had reported gum bleeding but states this has resolved. Per OV notes: "Continue anticoagulation with Xarelto. I advised him to schedule an appointment with his dentist to see why he is having bleeding from his gums. If this continues to be an issue, I might consider switching him to Eliquis to see if there is a difference."  Will forward to Dr. Fletcher Anon to review and advise.

## 2015-12-08 NOTE — Telephone Encounter (Signed)
Pt calling stating he was told by ENT that he may need to go on a lower dose on Xarelto for he is having nose bleed  He is on 20 mg currently  Please advise.

## 2015-12-08 NOTE — Telephone Encounter (Signed)
Reviewed recommendation to switch to eliquis w/pt who verbalized understanding. He understands to not take any more xarelto. Eliquis prescription submitted to [facility]. Pt reports he has not had any bleeding since he left ER. Dr. Tami Ribas took out packing and told patient there was nothing to cauterize.

## 2015-12-08 NOTE — Telephone Encounter (Signed)
PER PHARMACY INSURANCE REQIURES  PA FOR ELIQUIS 5 MG PO BID .  PLEASE CALL [phone]

## 2015-12-08 NOTE — Telephone Encounter (Signed)
Let's switch him to Eliquis 5 mg bid. Can't ENT stop the bleeding?

## 2015-12-09 NOTE — Telephone Encounter (Signed)
Pt has been approved for Eliquis 5 mg tablet  Outcome Approved PA Case [id number] is Approved. For further questions, call [facility] [phone].

## 2015-12-10 ENCOUNTER — Telehealth: Payer: Self-pay | Admitting: Cardiovascular Disease

## 2015-12-10 ENCOUNTER — Other Ambulatory Visit: Payer: Self-pay

## 2015-12-10 MED ORDER — APIXABAN 5 MG PO TABS: 5.0000 mg | ORAL_TABLET | Freq: Two times a day (BID) | ORAL | 6 refills | Status: DC

## 2015-12-10 NOTE — Telephone Encounter (Signed)
Pt approved through 04/11/16.

## 2015-12-10 NOTE — Telephone Encounter (Signed)
Pharmacy calling stating we prescribed eliquis We ordered 30 Medication is a bit much for patient They need [place] to send it for 60  Please call back

## 2016-05-10 ENCOUNTER — Telehealth: Payer: Self-pay | Admitting: Cardiovascular Disease

## 2016-05-10 NOTE — Telephone Encounter (Signed)
Patient had episode of mouth bleeding last night .  He did a half dose this morning and will do this again tonight.  Patient says bleeding did stop this morning.  Patient made appt with Arida 05/27/16 and wants to clarify that it is ok to wait this long.  Please call.

## 2016-05-10 NOTE — Telephone Encounter (Signed)
Pt reports small area of gum bleeding @ 8pm yesterday.  Pt states it was in the same area of his mouth as before and it has been a year since the last incident. He applied ice and his gum bled "off and on until 4am" at which time it resolved. No signs of blood since then.  Pt denies any other signs of bleeding including urine, stool, or nose.  Pt takes eliquis 5mg  BID for afib. He took 1/2 dose of eliquis this morning.  Advised pt to take eliquis as prescribed and to avoid decreasing dosage. He will continue to monitor for any sx of bleeding and call if sx return.  He will follow up with his dentist as the gum bleeding was an issue before eliquis.  Pt agreeable with plan.

## 2016-05-27 ENCOUNTER — Ambulatory Visit (INDEPENDENT_AMBULATORY_CARE_PROVIDER_SITE_OTHER): Payer: Medicare Other | Admitting: Cardiovascular Disease

## 2016-05-27 ENCOUNTER — Encounter: Payer: Self-pay | Admitting: Cardiovascular Disease

## 2016-05-27 VITALS — BP 130/62 | HR 66 | Ht 76.0 in | Wt 205.2 lb

## 2016-05-27 DIAGNOSIS — I2510 Atherosclerotic heart disease of native coronary artery without angina pectoris: Secondary | ICD-10-CM | POA: Diagnosis not present

## 2016-05-27 DIAGNOSIS — E785 Hyperlipidemia, unspecified: Secondary | ICD-10-CM

## 2016-05-27 DIAGNOSIS — I482 Chronic atrial fibrillation, unspecified: Secondary | ICD-10-CM

## 2016-05-27 DIAGNOSIS — I10 Essential (primary) hypertension: Secondary | ICD-10-CM | POA: Diagnosis not present

## 2016-05-27 NOTE — Patient Instructions (Signed)
Medication Instructions:  Your physician recommends that you continue on your current medications as directed. Please refer to the Current Medication list given to you today.   Labwork: BMET, CBC, lipid, and liver profile today  Testing/Procedures: none  Follow-Up: Your physician wants you to follow-up in: 6 months with Dr. Fletcher Anon.  You will receive a reminder letter in the mail two months in advance. If you don't receive a letter, please call our office to schedule the follow-up appointment.   Any Other Special Instructions Will Be Listed Below (If Applicable).     If you need a refill on your cardiac medications before your next appointment, please call your pharmacy.

## 2016-05-27 NOTE — Progress Notes (Signed)
Cardiology Office Note   Date:  05/27/2016   ID:  Walter Mosley., DOB 1940/04/24, MRN [id number]  PCP:  Walter Jarvis, MD  Cardiologist:   Walter Sacramento, MD   Chief Complaint  Patient presents with  . other    6 month follow up. Patient states he is "doing well" Meds reviewed verbally with patient.       History of Present Illness: Walter Mosley. is a 76 y.o. male who presents for a followup visit regarding coronary artery disease and chronic atrial fibrillation . He has a long history of coronary disease s/p bypass surgery in 1993. No ischemic cardiac events since then. He had atrial flutter in 2010  and underwent successful ablation by Dr. Lovena Le.   He had episodes of symptomatic bradycardia in 2014.  This improved after stopping Diltiazem . Echocardiogram in 11/2012  showed normal LV systolic function, mild left ventricular hypertrophy and mildly dilated left atrium.  He had bleeding problems from his gums as well as epistaxis on Xarelto. He was switched to Eliquis. He reports significant improvement since thenWith minimal leading at the present time. He denies any chest pain, shortness of breath or palpitations.   Past Medical History:  Diagnosis Date  . Atrial flutter ([place])    s/p ablation  . Cancer (HCC)    squamous cell carcimnoma right leg  . Coronary artery disease    Artery bypass graft  . GERD (gastroesophageal reflux disease)   . Hyperlipidemia    Mixed  . Hypertension    Unspecified  . 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 Osteoarthritis, knee   . PAT (paroxysmal atrial tachycardia) ([place])   . PSVT (paroxysmal supraventricular tachycardia) ([place])     Past Surgical History:  Procedure Laterality Date  . CARDIAC CATHETERIZATION  1993  . CARDIAC ELECTROPHYSIOLOGY STUDY AND ABLATION    . CORONARY ARTERY BYPASS GRAFT  1993   CABG x 5 @ [place]  . HEMORRHOID SURGERY    . MOHS SURGERY       Current Outpatient Prescriptions    Medication Sig Dispense Refill  . amLODipine (NORVASC) 10 MG tablet Take 10 mg by mouth daily.     [name] apixaban (ELIQUIS) 5 MG TABS tablet Take 1 tablet (5 mg total) by mouth 2 (two) times daily. 60 tablet 6  . atenolol (TENORMIN) 50 MG tablet Take 50 mg by mouth daily.     [name] atorvastatin (LIPITOR) 80 MG tablet Take 80 mg by mouth daily.     [name] ezetimibe (ZETIA) 10 MG tablet Take 1 tablet (10 mg total) by mouth daily. (Patient taking differently: Take 10 mg by mouth every evening. ) 30 tablet 5  . hydrochlorothiazide (HYDRODIURIL) 25 MG tablet Take 25 mg by mouth daily.     . Omega-3 Fatty Acids (FISH OIL) 1200 MG CAPS Take 1,200 mg by mouth 2 (two) times daily.    . potassium chloride (K-DUR) 10 MEQ tablet Take 20 mEq by mouth every evening.      No current facility-administered medications for this visit.     Allergies:   Patient has no known allergies.    Social History:  The patient  reports that he has never smoked. He has never used smokeless tobacco. He reports that he does not drink alcohol or use drugs.   Family History:  The patient's family history includes Coronary artery disease in his other; Heart attack (age of onset: 63) in his father;  Heart disease in his father.    ROS:  Please see the history of present illness.   Otherwise, review of systems are positive for none.   All other systems are reviewed and negative.    PHYSICAL EXAM: VS:  BP 130/62 (BP Location: Left Arm, Patient Position: Sitting, Cuff Size: Normal)   Pulse 66   Ht 6\' 4"  (1.93 m)   Wt 205 lb 4 oz (93.1 kg)   BMI 24.98 kg/m  , BMI Body mass index is 24.98 kg/m. GEN: Well nourished, well developed, in no acute distress  HEENT: normal  Neck: no JVD, carotid bruits, or masses Cardiac: Irregularly irregular; no murmurs, rubs, or gallops,no edema  Respiratory:  clear to auscultation bilaterally, normal work of breathing GI: soft, nontender, nondistended, + BS MS: no deformity or atrophy  Skin: warm and  dry, no rash Neuro:  Strength and sensation are intact Psych: euthymic mood, full affect   EKG:  EKG is ordered today. The ekg ordered today demonstrates atrial fibrillation with ventricular rate of 66 bpm.   Recent Labs: 12/02/2015: ALT 20; BUN 16; Creatinine, Ser 0.99; Hemoglobin 13.8; Platelets 154; Potassium 3.2; Sodium 136    Lipid Panel    Component Value Date/Time   CHOL 128 03/31/2015 0832   TRIG 92 03/31/2015 0832   HDL 33 (L) 03/31/2015 0832   CHOLHDL 3.9 03/31/2015 0832   CHOLHDL 6.0 CALC 06/24/2008 1202   VLDL 30 06/24/2008 1202   LDLCALC 77 03/31/2015 0832      Wt Readings from Last 3 Encounters:  05/27/16 205 lb 4 oz (93.1 kg)  12/02/15 205 lb (93 kg)  10/23/15 205 lb 8 oz (93.2 kg)        ASSESSMENT AND PLAN:  1.  Chronic atrial fibrillation: He is overall asymptomatic. Ventricular rate is well controlled on current dose of atenolol. Continue anticoagulation with Eliquis. I requested CBC and basic metabolic profile.  2. Coronary artery disease involving native coronary arteries without angina: He is overall doing well. Continue medical therapy.  3. Essential hypertension: Blood pressure is controlled on current medications.  4. Hyperlipidemia:  He is currently on maximal dose atorvastatin and Zetia. I requested lipid and liver profile. Target LDL is less than 70.    Disposition:   FU with me in 6 months  Signed,  Walter Sacramento, MD  05/27/2016 8:07 AM    [place]

## 2016-05-28 LAB — HEPATIC FUNCTION PANEL
ALK PHOS: 65 IU/L (ref 39–117)
ALT: 17 IU/L (ref 0–44)
AST: 16 IU/L (ref 0–40)
Albumin: 4.6 g/dL (ref 3.5–4.8)
Bilirubin Total: 0.8 mg/dL (ref 0.0–1.2)
Bilirubin, Direct: 0.2 mg/dL (ref 0.00–0.40)
TOTAL PROTEIN: 7.1 g/dL (ref 6.0–8.5)

## 2016-05-28 LAB — LIPID PANEL
CHOL/HDL RATIO: 4.2 ratio (ref 0.0–5.0)
Cholesterol, Total: 134 mg/dL (ref 100–199)
HDL: 32 mg/dL — ABNORMAL LOW (ref >39)
LDL Calculated: 79 mg/dL (ref 0–99)
Triglycerides: 116 mg/dL (ref 0–149)
VLDL CHOLESTEROL CAL: 23 mg/dL (ref 5–40)

## 2016-05-28 LAB — CBC
HEMATOCRIT: 41 % (ref 37.5–51.0)
HEMOGLOBIN: 14 g/dL (ref 13.0–17.7)
MCH: 28.7 pg (ref 26.6–33.0)
MCHC: 34.1 g/dL (ref 31.5–35.7)
MCV: 84 fL (ref 79–97)
Platelets: 220 10*3/uL (ref 150–379)
RBC: 4.87 x10E6/uL (ref 4.14–5.80)
RDW: 14.4 % (ref 12.3–15.4)
WBC: 6 10*3/uL (ref 3.4–10.8)

## 2016-05-28 LAB — BASIC METABOLIC PANEL
BUN/Creatinine Ratio: 13 (ref 10–24)
BUN: 12 mg/dL (ref 8–27)
CALCIUM: 9.1 mg/dL (ref 8.6–10.2)
CHLORIDE: 98 mmol/L (ref 96–106)
CO2: 24 mmol/L (ref 18–29)
CREATININE: 0.95 mg/dL (ref 0.76–1.27)
GFR, EST AFRICAN AMERICAN: 90 mL/min/{1.73_m2} (ref >59)
GFR, EST NON AFRICAN AMERICAN: 77 mL/min/{1.73_m2} (ref >59)
Glucose: 125 mg/dL — ABNORMAL HIGH (ref 65–99)
Potassium: 3.7 mmol/L (ref 3.5–5.2)
Sodium: 140 mmol/L (ref 134–144)

## 2016-08-09 ENCOUNTER — Telehealth: Payer: Self-pay | Admitting: Cardiovascular Disease

## 2016-08-09 NOTE — Telephone Encounter (Signed)
Pt states since Feb he has gotten tired easily and can hardly walk. He saw his PCP today, and he advised him to call DR. Arida.

## 2016-08-09 NOTE — Telephone Encounter (Signed)
Pt reports increased SOB and fatigue for 2 months.  He discussed this w/PCP, Dr. Ladoris Gene, at [facility] today. PCP told pt he would message Dr. Fletcher Anon. He feels fatigued after walking one block and is concerned as he use to walk 2 miles a day without any SOB. He has been more tired in the evenings. Reviewed medications. He is taking all as prescribed. He does not take BP or HR at home but reports BP 119/58 today in PCP office. He has hx of chronic afib and reports "not feeling this way in a long time". Will review with Dr. Fletcher Anon after PCP sends note.  Will call pt back with recommendations.  Pt agreeable w/plan.

## 2016-08-09 NOTE — Telephone Encounter (Signed)
Left message on machine for patient to contact the office.   

## 2016-08-10 NOTE — Telephone Encounter (Signed)
Routed to MD

## 2016-08-11 ENCOUNTER — Other Ambulatory Visit: Payer: Self-pay

## 2016-08-11 DIAGNOSIS — R5383 Other fatigue: Secondary | ICD-10-CM

## 2016-08-11 DIAGNOSIS — R0602 Shortness of breath: Secondary | ICD-10-CM

## 2016-08-11 NOTE — Telephone Encounter (Signed)
Left message on machine for patient to contact the office.   

## 2016-08-11 NOTE — Telephone Encounter (Signed)
Reviewed recommendations with pt who is agreeable with plan. Lexiscan May 7, 7:15am arrival Echo May 8, 11:30am F/u Ignacia Bayley, May 22, 3:30pm Reviewed lexi instructions w/pt who wrote info down and repeated back to me. He understands to not consume caffeine 24 hours before and understands that beverages labeled "caffeine-free" still contain some caffeine and should be avoided. He denies smoking.  Pt requests Eliquis samples. Medication Samples have been provided to the patient.  Drug name: Eliquis       Strength: 5mg         Qty: 3 boxes  LOT: [id number]  Exp.Date: 4/20  Dosing instructions: 5mg  BID  The patient has been instructed regarding the correct time, dose, and frequency of taking this medication, including desired effects and most common side effects.   Walter Mosley 11:13 AM 08/11/2016

## 2016-08-11 NOTE — Telephone Encounter (Signed)
Schedule him for a lexiscan myoview and echo then follow up after tests.

## 2016-08-16 ENCOUNTER — Ambulatory Visit
Admission: RE | Admit: 2016-08-16 | Discharge: 2016-08-16 | Disposition: A | Discharge status: Home / Self Care | Payer: Medicare Other | Source: Ambulatory Visit | Attending: Cardiovascular Disease | Admitting: Cardiovascular Disease

## 2016-08-16 DIAGNOSIS — R0602 Shortness of breath: Secondary | ICD-10-CM | POA: Insufficient documentation

## 2016-08-16 DIAGNOSIS — R5383 Other fatigue: Secondary | ICD-10-CM | POA: Diagnosis present

## 2016-08-16 LAB — NM MYOCAR MULTI W/SPECT W/WALL MOTION / EF
CHL CUP NUCLEAR SRS: 0
CHL CUP NUCLEAR SSS: 0
CHL CUP RESTING HR STRESS: 82 {beats}/min
LV dias vol: 100 mL (ref 62–150)
LVSYSVOL: 27 mL
NUC STRESS TID: 0.9
Peak HR: 105 {beats}/min
Percent HR: 72 %
SDS: 0

## 2016-08-16 MED ORDER — TECHNETIUM TC 99M TETROFOSMIN IV KIT
30.3490 | PACK | Freq: Once | INTRAVENOUS | Status: AC | PRN
  Administered 2016-08-16: 30.349 via INTRAVENOUS

## 2016-08-16 MED ORDER — TECHNETIUM TC 99M TETROFOSMIN IV KIT
13.0000 | PACK | Freq: Once | INTRAVENOUS | Status: AC | PRN
  Administered 2016-08-16: 12.603 via INTRAVENOUS

## 2016-08-16 MED ORDER — REGADENOSON 0.4 MG/5ML IV SOLN
0.4000 mg | Freq: Once | INTRAVENOUS | Status: AC
  Administered 2016-08-16: 0.4 mg via INTRAVENOUS

## 2016-08-17 ENCOUNTER — Other Ambulatory Visit: Payer: Self-pay

## 2016-08-17 ENCOUNTER — Ambulatory Visit (INDEPENDENT_AMBULATORY_CARE_PROVIDER_SITE_OTHER): Payer: Medicare Other

## 2016-08-17 DIAGNOSIS — R0602 Shortness of breath: Secondary | ICD-10-CM

## 2016-08-17 DIAGNOSIS — R5383 Other fatigue: Secondary | ICD-10-CM

## 2016-08-31 ENCOUNTER — Encounter: Payer: Self-pay | Admitting: Nurse Practitioner

## 2016-08-31 ENCOUNTER — Ambulatory Visit (INDEPENDENT_AMBULATORY_CARE_PROVIDER_SITE_OTHER): Payer: Medicare Other | Admitting: Nurse Practitioner

## 2016-08-31 ENCOUNTER — Inpatient Hospital Stay: Payer: Medicare Other | Attending: Oncology | Admitting: Oncology

## 2016-08-31 ENCOUNTER — Encounter: Payer: Self-pay | Admitting: Oncology

## 2016-08-31 ENCOUNTER — Inpatient Hospital Stay: Payer: Medicare Other

## 2016-08-31 VITALS — BP 128/60 | HR 58 | Ht 76.0 in | Wt 198.0 lb

## 2016-08-31 VITALS — BP 121/59 | HR 67 | Temp 97.7°F | Ht 76.0 in | Wt 197.0 lb

## 2016-08-31 DIAGNOSIS — I482 Chronic atrial fibrillation, unspecified: Secondary | ICD-10-CM

## 2016-08-31 DIAGNOSIS — E785 Hyperlipidemia, unspecified: Secondary | ICD-10-CM

## 2016-08-31 DIAGNOSIS — E119 Type 2 diabetes mellitus without complications: Secondary | ICD-10-CM | POA: Diagnosis not present

## 2016-08-31 DIAGNOSIS — R0609 Other forms of dyspnea: Secondary | ICD-10-CM

## 2016-08-31 DIAGNOSIS — D649 Anemia, unspecified: Secondary | ICD-10-CM | POA: Diagnosis not present

## 2016-08-31 DIAGNOSIS — Z79899 Other long term (current) drug therapy: Secondary | ICD-10-CM

## 2016-08-31 DIAGNOSIS — Z8614 Personal history of Methicillin resistant Staphylococcus aureus infection: Secondary | ICD-10-CM | POA: Diagnosis not present

## 2016-08-31 DIAGNOSIS — R7989 Other specified abnormal findings of blood chemistry: Secondary | ICD-10-CM | POA: Insufficient documentation

## 2016-08-31 DIAGNOSIS — D509 Iron deficiency anemia, unspecified: Secondary | ICD-10-CM | POA: Insufficient documentation

## 2016-08-31 DIAGNOSIS — I2510 Atherosclerotic heart disease of native coronary artery without angina pectoris: Secondary | ICD-10-CM

## 2016-08-31 DIAGNOSIS — Z7984 Long term (current) use of oral hypoglycemic drugs: Secondary | ICD-10-CM | POA: Diagnosis not present

## 2016-08-31 DIAGNOSIS — D696 Thrombocytopenia, unspecified: Secondary | ICD-10-CM

## 2016-08-31 DIAGNOSIS — Z7901 Long term (current) use of anticoagulants: Secondary | ICD-10-CM

## 2016-08-31 DIAGNOSIS — K219 Gastro-esophageal reflux disease without esophagitis: Secondary | ICD-10-CM

## 2016-08-31 DIAGNOSIS — I4892 Unspecified atrial flutter: Secondary | ICD-10-CM | POA: Diagnosis not present

## 2016-08-31 DIAGNOSIS — I10 Essential (primary) hypertension: Secondary | ICD-10-CM

## 2016-08-31 LAB — CBC WITH DIFFERENTIAL/PLATELET
Basophils Absolute: 0 10*3/uL (ref 0–0.1)
Basophils Relative: 1 %
EOS PCT: 2 %
Eosinophils Absolute: 0.1 10*3/uL (ref 0–0.7)
HEMATOCRIT: 37.4 % — AB (ref 40.0–52.0)
Hemoglobin: 12.9 g/dL — ABNORMAL LOW (ref 13.0–18.0)
LYMPHS PCT: 27 %
Lymphs Abs: 1.3 10*3/uL (ref 1.0–3.6)
MCH: 28.7 pg (ref 26.0–34.0)
MCHC: 34.4 g/dL (ref 32.0–36.0)
MCV: 83.5 fL (ref 80.0–100.0)
MONO ABS: 0.6 10*3/uL (ref 0.2–1.0)
MONOS PCT: 13 %
Neutro Abs: 2.8 10*3/uL (ref 1.4–6.5)
Neutrophils Relative %: 57 %
PLATELETS: 188 10*3/uL (ref 150–440)
RBC: 4.48 MIL/uL (ref 4.40–5.90)
RDW: 14.3 % (ref 11.5–14.5)
WBC: 4.8 10*3/uL (ref 3.8–10.6)

## 2016-08-31 LAB — FOLATE: FOLATE: 17.9 ng/mL (ref >5.9)

## 2016-08-31 LAB — PATHOLOGIST SMEAR REVIEW

## 2016-08-31 LAB — VITAMIN B12: VITAMIN B 12: 334 pg/mL (ref 180–914)

## 2016-08-31 LAB — FERRITIN: FERRITIN: 19 ng/mL — AB (ref 24–336)

## 2016-08-31 NOTE — Progress Notes (Signed)
Patient reports 1 dark stool this AM. Reports being on eliquis (states he was one xarelto but had more issues with bleeding so he was swtiched.) Reports 1 new medication of Metformin 500 mg po daily. Reports he started medication 2 weeks ago. Denies any abdominal pain or any other unusual bleeding. Reports increased SOB for past 2 weeks. States he could walk 3-4 mils per day but now has trouble walking a couple of blocks. States he has cardiologist appt this afternoon.

## 2016-08-31 NOTE — Patient Instructions (Signed)
Medication Instructions:  Your physician has recommended you make the following change in your medication:  1. Medication Samples have been provided to the patient.  Drug name: Eliquis       Strength: 5 mg        Qty: 4 boxes  LOT: [id number]    Exp.Date: 12/20  Dosing instructions: Take 1 tablet twice a day   Follow-Up: Your physician recommends that you schedule a follow-up appointment in: 3 months with Dr. Fletcher Anon.    It was a pleasure seeing you today here in the office. Please do not hesitate to give [place] a call back if you have any further questions. [place], BSN

## 2016-08-31 NOTE — Progress Notes (Signed)
Hematology/Oncology Consult note [facility] [phone] [phone]  Patient Care Team: Theotis Burrow, MD as PCP - General (Family Medicine)   Name of the patient: Walter Mosley  [id number]  [birth year]    Reason for referral- thrombocytopenia   Referring physician- Dr. Alene Mires  Date of visit: 08/31/16   History of presenting illness- patient is a 76 year old male was then referred to [place] for thrombocytopenia. Patient has had multiple CBCs with [place] in the past including most recent one from February 2018 which showed a normal platelet count. Recent CBC that he had with his primary care doctor in May 2018 showed white count of 8.2, H&H of 12.8/37.2 and a platelet count of 134. Patient is currently on Eliquis for his C7 there was concern for mild anemia and thrombocytopenia in the setting of Eliquis use and hence has been referred to [place] for the same. His CMP was within normal limits TSH was within normal limits and HIV hepatitis A, B, and C testing was also done was normal. Iron studies showed iron saturation of 13% and TIBC that was normal at 366.  Patient is currently not on any oral iron. He reports one episode of dark stools this morning but has not had any such episodes in the past. Denies any bright red blood in his stool  Pain scale- 0   Review of systems- Review of Systems  Constitutional: Negative for chills, fever, malaise/fatigue and weight loss.  HENT: Negative for congestion, ear discharge and nosebleeds.   Eyes: Negative for blurred vision.  Respiratory: Negative for cough, hemoptysis, sputum production, shortness of breath and wheezing.   Cardiovascular: Negative for chest pain, palpitations, orthopnea and claudication.  Gastrointestinal: Negative for abdominal pain, blood in stool, constipation, diarrhea, heartburn, melena, nausea and vomiting.  Genitourinary: Negative for dysuria, flank pain, frequency, hematuria and  urgency.  Musculoskeletal: Negative for back pain, joint pain and myalgias.  Skin: Negative for rash.  Neurological: Negative for dizziness, tingling, focal weakness, seizures, weakness and headaches.  Endo/Heme/Allergies: Does not bruise/bleed easily.  Psychiatric/Behavioral: Negative for depression and suicidal ideas. The patient does not have insomnia.     No Known Allergies  Patient Active Problem List   Diagnosis Date Noted  . Abscess   . Cellulitis 02/11/2015  . Chronic atrial fibrillation ([place]) 12/31/2013  . Pre-syncope 11/16/2012  . Coronary artery disease   . Hyperlipidemia 01/29/2009  . Essential hypertension 01/29/2009  . CAD, ARTERY BYPASS GRAFT 01/29/2009  . SVT/ PSVT/ PAT 01/29/2009  . ATRIAL FLUTTER 01/29/2009     Past Medical History:  Diagnosis Date  . Atrial flutter ([place])    s/p ablation  . Cancer (HCC)    squamous cell carcimnoma right leg  . Coronary artery disease    Artery bypass graft  . GERD (gastroesophageal reflux disease)   . Hyperlipidemia    Mixed  . Hypertension    Unspecified  . 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 Osteoarthritis, knee   . PAT (paroxysmal atrial tachycardia) ([place])   . PSVT (paroxysmal supraventricular tachycardia) ([place])      Past Surgical History:  Procedure Laterality Date  . CARDIAC CATHETERIZATION  1993  . CARDIAC ELECTROPHYSIOLOGY STUDY AND ABLATION    . CORONARY ARTERY BYPASS GRAFT  1993   CABG x 5 @ [facility]  . HEMORRHOID SURGERY    . MOHS SURGERY      Social History   Social History  . Marital status: Married  Spouse name: N/A  . Number of children: N/A  . Years of education: N/A   Occupational History  . Retired    Social History Main Topics  . Smoking status: Never Smoker  . Smokeless tobacco: Never Used  . Alcohol use No  . Drug use: No  . Sexual activity: Not on file   Other Topics Concern  . Not on file   Social History Narrative   Gets regular exercise.      Family History  Problem Relation Age of Onset  . Heart disease Father   . Heart attack Father 20       MI  . Coronary artery disease Other      Current Outpatient Prescriptions:  .  amLODipine (NORVASC) 10 MG tablet, Take 10 mg by mouth daily. , Disp: , Rfl:  .  apixaban (ELIQUIS) 5 MG TABS tablet, Take 1 tablet (5 mg total) by mouth 2 (two) times daily., Disp: 60 tablet, Rfl: 6 .  atenolol (TENORMIN) 50 MG tablet, Take 50 mg by mouth daily. , Disp: , Rfl:  .  atorvastatin (LIPITOR) 80 MG tablet, Take 80 mg by mouth daily. , Disp: , Rfl:  .  ezetimibe (ZETIA) 10 MG tablet, Take 1 tablet (10 mg total) by mouth daily. (Patient taking differently: Take 10 mg by mouth every evening. ), Disp: 30 tablet, Rfl: 5 .  hydrochlorothiazide (HYDRODIURIL) 25 MG tablet, Take 25 mg by mouth daily. , Disp: , Rfl:  .  Omega-3 Fatty Acids (FISH OIL) 1200 MG CAPS, Take 1,200 mg by mouth 2 (two) times daily., Disp: , Rfl:  .  potassium chloride (K-DUR) 10 MEQ tablet, Take 20 mEq by mouth every evening. , Disp: , Rfl:    Physical exam:  Vitals:   08/31/16 1007  BP: (!) 121/59  Pulse: 67  Temp: 97.7 F (36.5 C)  TempSrc: Tympanic  SpO2: 97%  Weight: 197 lb (89.4 kg)  Height: 6\' 4"  (1.93 m)   Physical Exam  Constitutional: He is oriented to person, place, and time and well-developed, well-nourished, and in no distress.  HENT:  Head: Normocephalic and atraumatic.  Eyes: EOM are normal. Pupils are equal, round, and reactive to light.  Neck: Normal range of motion.  Cardiovascular: Normal rate and normal heart sounds.   HR irregular  Pulmonary/Chest: Effort normal and breath sounds normal.  Abdominal: Soft. Bowel sounds are normal.  Neurological: He is alert and oriented to person, place, and time.  Skin: Skin is warm and dry.       CMP Latest Ref Rng & Units 05/27/2016  Glucose 65 - 99 mg/dL 125(H)  BUN 8 - 27 mg/dL 12  Creatinine 0.76 - 1.27 mg/dL 0.95  Sodium 134 - 144 mmol/L 140   Potassium 3.5 - 5.2 mmol/L 3.7  Chloride 96 - 106 mmol/L 98  CO2 18 - 29 mmol/L 24  Calcium 8.6 - 10.2 mg/dL 9.1  Total Protein 6.0 - 8.5 g/dL 7.1  Total Bilirubin 0.0 - 1.2 mg/dL 0.8  Alkaline Phos 39 - 117 IU/L 65  AST 0 - 40 IU/L 16  ALT 0 - 44 IU/L 17   CBC Latest Ref Rng & Units 05/27/2016  WBC 3.4 - 10.8 x10E3/uL 6.0  Hemoglobin 13.0 - 18.0 g/dL -  Hematocrit 37.5 - 51.0 % 41.0  Platelets 150 - 379 x10E3/uL 220    No images are attached to the encounter.  Nm Myocar Multi W/spect W/wall Motion / Ef  Result Date: 08/16/2016  There was no ST segment deviation noted during stress.  No T wave inversion was noted during stress.  The study is normal.  This is a low risk study.  The left ventricular ejection fraction is normal (55-65%).     Assessment and plan- Patient is a 76 y.o. male referred to [place] for thrombocytopenia  Patient's thrombocytopenia is very mild and so is his anemia. Also patient's prior platelet counts have been normal and he has had only one mildly low platelet count. Patient has had most of his workup done as above. Since he had a low iron saturation on his blood work I will proceed with getting a ferritin today. I will also check CBC, B12, folate, haptoglobin, myeloma panel and pathology review of his smear. I will see him back in 2 weeks' time to discuss the results of his blood work   Thank you for this kind referral and the opportunity to participate in the care of this patient   Visit Diagnosis 1. Thrombocytopenia ([place])   2. Normocytic anemia     Dr. Randa Evens, MD, MPH [place] [facility] Pager- [phone] 08/31/2016

## 2016-08-31 NOTE — Progress Notes (Signed)
Office Visit    Patient Name: Walter Mosley. Date of Encounter: 08/31/2016  Primary Care Provider:  Theotis Burrow, MD Primary Cardiologist:  Jerilynn Mages. Fletcher Anon, MD   Chief Complaint    76 year old male with prior history of CAD status post coronary artery bypass grafting in 1993, hypertension, hyperlipidemia, diabetes, and chronic atrial fibrillation on eliquis therapy who presents for follow-up related to dyspnea on exertion.  Past Medical History    Past Medical History:  Diagnosis Date  . Atrial flutter ([place])    s/p ablation  . Cancer (HCC)    squamous cell carcimnoma right leg  . Chronic atrial fibrillation (HCC)    a. CHA2DS2VASc = 5-->eliquis 5 bid. (h/o epistaxis and gingival bleeding on xarelto).  . Coronary artery disease    a. 1993 s/p CABG;  b. 08/2016 Lexi MV: no ischemia/infarct->low risk. EF 55-65%.  . Diabetes mellitus without complication ([place])   . Dyspnea on exertion    a. 08/2016 Echo: EF 55-60%, no rwma, mild MR, mildly dil LA/RA, mild MR.  [name] GERD (gastroesophageal reflux disease)   . Hyperlipidemia    Mixed  . Hypertension    Unspecified  . 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 Osteoarthritis, knee   . PAT (paroxysmal atrial tachycardia) ([place])   . PSVT (paroxysmal supraventricular tachycardia) ([place])    Past Surgical History:  Procedure Laterality Date  . CARDIAC CATHETERIZATION  1993  . CARDIAC ELECTROPHYSIOLOGY STUDY AND ABLATION    . CARDIAC SURGERY     by pass  . CORONARY ARTERY BYPASS GRAFT  1993   CABG x 5 @ [facility]  . HEMORRHOID SURGERY    . MOHS SURGERY      Allergies  No Known Allergies  History of Present Illness    76 year old male with the above complex past medical history including coronary artery disease status post coronary artery bypass grafting in 1993. He also has a history of atrial flutter status post catheter ablation in 2010. He also chronic atrial fibrillation which is well rate controlled. He  is adequately with eliquis. He used to be pretty active and was walking several miles a day until about 3 or 4 years ago. Up until about 6 months ago, he was often walking a mile but notes that his activity has really fallen off. He does have some knee pain when he walks or tries to limit himself to shorter distances. About 3 weeks ago or so, he was at the beach and was walking on the street and just noted fatigue and dyspnea on exertion. For that reason, he contacted our office and was set up for an echocardiogram, which showed normal LV function without any significant valvular disease. Stress test was also undertaken which was negative for ischemia. In follow-up today, he notes that his exercise intolerance has improved some. He has been walking some more, a few blocks at a time. He did see his hematologist this morning given prior history of thrombocytopenia. Lab work just returned and he is not particularly anemic. He did have a dark stool this morning but had a subsequent stool this afternoon which is normal. He was not having any dark stools prior to the one he had this morning. Overall, he feels better. He has not been having a chest pain, palpitations, PND, orthopnea, dizziness, syncope, edema, or early satiety.  Home Medications    Prior to Admission medications   Medication Sig Start Date End Date Taking? Authorizing Provider  amLODipine (NORVASC) 10 MG tablet Take 10 mg by mouth daily.    Yes [provider]  apixaban (ELIQUIS) 5 MG TABS tablet Take 1 tablet (5 mg total) by mouth 2 (two) times daily. 12/10/15  Yes Wellington Hampshire, MD  atenolol (TENORMIN) 50 MG tablet Take 50 mg by mouth daily.    Yes [provider]  atorvastatin (LIPITOR) 80 MG tablet Take 80 mg by mouth daily.    Yes [provider]  ezetimibe (ZETIA) 10 MG tablet Take 1 tablet (10 mg total) by mouth daily. Patient taking differently: Take 10 mg by mouth every evening.  01/17/15  Yes Wellington Hampshire, MD  hydrochlorothiazide (HYDRODIURIL) 25 MG tablet Take 25 mg by mouth daily.    Yes [provider]  metFORMIN (GLUCOPHAGE) 500 MG tablet Take 500 mg by mouth 2 (two) times daily with a meal.   Yes [provider]  Omega-3 Fatty Acids (FISH OIL) 1200 MG CAPS Take 1,200 mg by mouth 2 (two) times daily.   Yes [provider]  potassium chloride (K-DUR) 10 MEQ tablet Take 20 mEq by mouth every evening.    Yes [provider]    Review of Systems    As above, he experienced dyspnea on exertion. Weeks ago which has improved some. He denies chest pain, palpitations, PND, orthopnea, dizziness, syncope, edema, or early satiety.  All other systems reviewed and are otherwise negative except as noted above.  Physical Exam    VS:  BP 128/60 (BP Location: Left Arm, Patient Position: Sitting, Cuff Size: Normal)   Pulse (!) 58   Ht 6\' 4"  (1.93 m)   Wt 198 lb (89.8 kg)   BMI 24.10 kg/m  , BMI Body mass index is 24.1 kg/m. GEN: Well nourished, well developed, in no acute distress.  HEENT: normal.  Neck: Supple, no JVD, carotid bruits, or masses. Cardiac: Irregularly irregular, no murmurs, rubs, or gallops. No clubbing, cyanosis, edema.  Radials/DP/PT 2+ and equal bilaterally.  Respiratory:  Respirations regular and unlabored, clear to auscultation bilaterally. GI: Soft, nontender, nondistended, BS + x 4. MS: no deformity or atrophy. Skin: warm and dry, no rash. Neuro:  Strength and sensation are intact. Psych: Normal affect.  Accessory Clinical Findings    ECG - Atrial fibrillation, 58, no acute ST or T changes.  Lab Results  Component Value Date   WBC 4.8 08/31/2016   HGB 12.9 (L) 08/31/2016   HCT 37.4 (L) 08/31/2016   MCV 83.5 08/31/2016   PLT 188 08/31/2016   Lab Results  Component Value Date   CREATININE 0.95 05/27/2016   BUN 12 05/27/2016   NA 140 05/27/2016   K 3.7 05/27/2016   CL 98 05/27/2016   CO2 24 05/27/2016    Recent  echo and stress test reviewed in detail with patient and updated in [place] above.  Assessment & Plan    1.  Dyspnea on exertion: Patient was recently experiencing dyspnea on exertion when walking at the beach about 3 weeks ago. This has improved some without any intervention. Echocardiogram was undertaken showed normal LV function without any significant valvular abnormalities. Stress testing was nonischemic. He has not had any chest pain. Labs drawn this morning show mild, stable anemia with a hemoglobin of 12.9 and hematocrit of 37.4. Renal function and electrolytes were normal. I suspect his symptoms may be result of deconditioning and have encouraged him to try and increase his activity some.  2. Coronary artery disease: As  above, is recently had some dyspnea on exertion. He has not had any chest pain. Ischemic testing was negative. LV function is normal. No plan for further ischemic evaluation at this time. Continue beta blocker, statin, and Zetia therapy. He has not aspirin as he is at regular with eliquis.  3. Hyperlipidemia: LDL was 79 earlier this year. He remains on high potency Lipitor and Zetia. I have encouraged him to increase his activity some. Could consider PCSK9 inhibitor, though he notes medication cost is prohibitive in general.  4. Essential hypertension: Blood pressure is stable on beta blocker and HCTZ therapy.  5. Chronic atrial fibrillation: This is rate controlled on beta blocker therapy. He is anticoagulant with eliquis. We will see if we have samples for him today.  6. Disposition: Patient with follow-up with Dr. Fletcher Anon in approximately 3 months to reevaluate symptoms.  Murray Hodgkins, NP 08/31/2016, 4:35 PM

## 2016-09-01 LAB — HAPTOGLOBIN: HAPTOGLOBIN: 72 mg/dL (ref 34–200)

## 2016-09-02 LAB — MULTIPLE MYELOMA PANEL, SERUM
ALBUMIN/GLOB SERPL: 1.4 (ref 0.7–1.7)
ALPHA 1: 0.2 g/dL (ref 0.0–0.4)
Albumin SerPl Elph-Mcnc: 3.8 g/dL (ref 2.9–4.4)
Alpha2 Glob SerPl Elph-Mcnc: 0.6 g/dL (ref 0.4–1.0)
B-Globulin SerPl Elph-Mcnc: 1 g/dL (ref 0.7–1.3)
Gamma Glob SerPl Elph-Mcnc: 1.1 g/dL (ref 0.4–1.8)
Globulin, Total: 2.9 g/dL (ref 2.2–3.9)
IGA: 219 mg/dL (ref 61–437)
IGM, SERUM: 67 mg/dL (ref 15–143)
IgG (Immunoglobin G), Serum: 903 mg/dL (ref 700–1600)
Total Protein ELP: 6.7 g/dL (ref 6.0–8.5)

## 2016-09-09 ENCOUNTER — Encounter: Payer: Self-pay | Admitting: Oncology

## 2016-09-09 ENCOUNTER — Inpatient Hospital Stay (HOSPITAL_BASED_OUTPATIENT_CLINIC_OR_DEPARTMENT_OTHER): Payer: Medicare Other | Admitting: Oncology

## 2016-09-09 VITALS — BP 145/75 | HR 78 | Temp 98.5°F | Resp 18 | Wt 198.0 lb

## 2016-09-09 DIAGNOSIS — I10 Essential (primary) hypertension: Secondary | ICD-10-CM

## 2016-09-09 DIAGNOSIS — I4892 Unspecified atrial flutter: Secondary | ICD-10-CM

## 2016-09-09 DIAGNOSIS — D509 Iron deficiency anemia, unspecified: Secondary | ICD-10-CM | POA: Diagnosis not present

## 2016-09-09 DIAGNOSIS — D696 Thrombocytopenia, unspecified: Secondary | ICD-10-CM | POA: Diagnosis not present

## 2016-09-09 DIAGNOSIS — E785 Hyperlipidemia, unspecified: Secondary | ICD-10-CM

## 2016-09-09 DIAGNOSIS — I2510 Atherosclerotic heart disease of native coronary artery without angina pectoris: Secondary | ICD-10-CM | POA: Diagnosis not present

## 2016-09-09 DIAGNOSIS — K219 Gastro-esophageal reflux disease without esophagitis: Secondary | ICD-10-CM | POA: Diagnosis not present

## 2016-09-09 DIAGNOSIS — R7989 Other specified abnormal findings of blood chemistry: Secondary | ICD-10-CM | POA: Diagnosis not present

## 2016-09-09 DIAGNOSIS — Z7984 Long term (current) use of oral hypoglycemic drugs: Secondary | ICD-10-CM

## 2016-09-09 DIAGNOSIS — Z7901 Long term (current) use of anticoagulants: Secondary | ICD-10-CM

## 2016-09-09 DIAGNOSIS — Z8614 Personal history of Methicillin resistant Staphylococcus aureus infection: Secondary | ICD-10-CM

## 2016-09-09 DIAGNOSIS — Z79899 Other long term (current) drug therapy: Secondary | ICD-10-CM

## 2016-09-09 DIAGNOSIS — E119 Type 2 diabetes mellitus without complications: Secondary | ICD-10-CM | POA: Diagnosis not present

## 2016-09-09 DIAGNOSIS — I482 Chronic atrial fibrillation: Secondary | ICD-10-CM

## 2016-09-09 NOTE — Progress Notes (Signed)
Here for follow up-states doing well. Pulse ox 97% on RA. Stated SOB has improved .

## 2016-09-09 NOTE — Progress Notes (Signed)
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 Patient has had multiple CBCs with [place] in the past including most recent one from February 2018 which showed a normal platelet count. Recent CBC that he had with his primary care doctor in May 2018 showed white count of 8.2, H&H of 12.8/37.2 and a platelet count of 134. Patient is currently on Eliquis for his afib there was concern for mild anemia and thrombocytopenia in the setting of Eliquis use and hence has been referred to [place] for the same. His CMP was within normal limits TSH was within normal limits and HIV hepatitis A, B, and C testing was also done was normal. Iron studies showed iron saturation of 13% and TIBC that was normal at 366.  Patient is currently not on any oral iron. He reports one episode of dark stools this morning but has not had any such episodes in the past. Denies any bright red blood in his stool  Further workup from 08/31/2016 was as follows: CBC showed white count of 4.8, H&H of 12.9/37.4 and a platelet count of 188. Peripheral smear review showed mild normocytic anemia without any morphological abnormalities of RBCs and WBCs. Mild thrombocytopenia with normal platelet morphology. Ferritin levels were low at 19. B12 and folate was within normal limits. Multiple myeloma panel showed no monoclonal protein. Haptoglobin was normal at 72   Interval history- doing well. Denies any bleeding or bruising. He has been taking molasses to bring up his platelet count    Review of systems-  Review of Systems  Constitutional: Negative for chills, fever, malaise/fatigue and weight loss.  HENT: Negative for congestion, ear discharge and nosebleeds.   Eyes: Negative for blurred vision.  Respiratory: Negative for cough, hemoptysis, sputum production, shortness of breath and wheezing.   Cardiovascular: Negative for chest pain, palpitations, orthopnea and claudication.  Gastrointestinal: Negative for abdominal pain, blood in stool, constipation, diarrhea, heartburn, melena, nausea and vomiting.  Genitourinary: Negative for dysuria, flank pain, frequency, hematuria and urgency.  Musculoskeletal: Negative for back pain, joint pain and myalgias.  Skin: Negative for rash.  Neurological: Negative for dizziness, tingling, focal weakness, seizures, weakness and headaches.  Endo/Heme/Allergies: Does not bruise/bleed easily.  Psychiatric/Behavioral: Negative for depression and suicidal ideas. The patient does not have insomnia.        No Known Allergies   Past Medical History:  Diagnosis Date  . Atrial flutter ([place])    s/p ablation  . Cancer (HCC)    squamous cell carcimnoma right leg  . Chronic atrial fibrillation (HCC)    a. CHA2DS2VASc = 5-->eliquis 5 bid. (h/o epistaxis and gingival bleeding on xarelto).  . Coronary artery disease    a. 1993 s/p CABG;  b. 08/2016 Lexi MV: no ischemia/infarct->low risk. EF 55-65%.  . Diabetes mellitus without complication ([place])   . Dyspnea on exertion    a. 08/2016 Echo: EF 55-60%, no rwma, mild MR, mildly dil LA/RA, mild MR.  [name] GERD (gastroesophageal reflux disease)   . Hyperlipidemia    Mixed  . Hypertension    Unspecified  . MRSA (methicillin resistant  staph aureus) culture positive   . Osteoarthritis, knee   . PAT (paroxysmal atrial tachycardia) ([place])   . PSVT (paroxysmal supraventricular tachycardia) ([place])      Past Surgical History:  Procedure Laterality Date  . CARDIAC CATHETERIZATION  1993  . CARDIAC ELECTROPHYSIOLOGY STUDY AND  ABLATION    . CARDIAC SURGERY     by pass  . CORONARY ARTERY BYPASS GRAFT  1993   CABG x 5 @ [facility]  . HEMORRHOID SURGERY    . MOHS SURGERY      Social History   Social History  . Marital status: Married    Spouse name: N/A  . Number of children: N/A  . Years of education: N/A   Occupational History  . Retired    Social History Main Topics  . Smoking status: Never Smoker  . Smokeless tobacco: Never Used  . Alcohol use No  . Drug use: No  . Sexual activity: Not on file   Other Topics Concern  . Not on file   Social History Narrative   Gets regular exercise.    Family History  Problem Relation Age of Onset  . Heart disease Father   . Heart attack Father 68       MI  . Coronary artery disease Other      Current Outpatient Prescriptions:  .  amLODipine (NORVASC) 10 MG tablet, Take 10 mg by mouth daily. , Disp: , Rfl:  .  apixaban (ELIQUIS) 5 MG TABS tablet, Take 1 tablet (5 mg total) by mouth 2 (two) times daily., Disp: 60 tablet, Rfl: 6 .  atenolol (TENORMIN) 50 MG tablet, Take 50 mg by mouth daily. , Disp: , Rfl:  .  atorvastatin (LIPITOR) 80 MG tablet, Take 80 mg by mouth daily. , Disp: , Rfl:  .  ezetimibe (ZETIA) 10 MG tablet, Take 1 tablet (10 mg total) by mouth daily. (Patient taking differently: Take 10 mg by mouth every evening. ), Disp: 30 tablet, Rfl: 5 .  hydrochlorothiazide (HYDRODIURIL) 25 MG tablet, Take 25 mg by mouth daily. , Disp: , Rfl:  .  metFORMIN (GLUCOPHAGE) 500 MG tablet, Take 500 mg by mouth 2 (two) times daily with a meal., Disp: , Rfl:  .  Omega-3 Fatty Acids (FISH OIL) 1200 MG CAPS, Take 1,200 mg by mouth 2 (two) times daily., Disp: , Rfl:  .  potassium chloride (K-DUR) 10 MEQ tablet, Take 20 mEq by mouth every evening. , Disp: , Rfl:   Physical exam:  Vitals:   09/09/16 1352  BP: (!) 145/75  Pulse: 78  Resp: 18  Temp: 98.5 F (36.9 C)  TempSrc: Tympanic  Weight: 198 lb (89.8 kg)   Physical Exam  Constitutional: He is oriented  to person, place, and time and well-developed, well-nourished, and in no distress.  HENT:  Head: Normocephalic and atraumatic.  Eyes: EOM are normal. Pupils are equal, round, and reactive to light.  Neck: Normal range of motion.  Cardiovascular: Normal rate, regular rhythm and normal heart sounds.   Pulmonary/Chest: Effort normal and breath sounds normal.  Abdominal: Soft. Bowel sounds are normal.  Neurological: He is alert and oriented to person, place, and time.  Skin: Skin is warm and dry.     CMP Latest Ref Rng & Units 05/27/2016  Glucose 65 - 99 mg/dL 125(H)  BUN 8 - 27 mg/dL 12  Creatinine 0.76 - 1.27 mg/dL 0.95  Sodium 134 - 144 mmol/L 140  Potassium 3.5 - 5.2 mmol/L 3.7  Chloride  96 - 106 mmol/L 98  CO2 18 - 29 mmol/L 24  Calcium 8.6 - 10.2 mg/dL 9.1  Total Protein 6.0 - 8.5 g/dL 7.1  Total Bilirubin 0.0 - 1.2 mg/dL 0.8  Alkaline Phos 39 - 117 IU/L 65  AST 0 - 40 IU/L 16  ALT 0 - 44 IU/L 17   CBC Latest Ref Rng & Units 08/31/2016  WBC 3.8 - 10.6 K/uL 4.8  Hemoglobin 13.0 - 18.0 g/dL 12.9(L)  Hematocrit 40.0 - 52.0 % 37.4(L)  Platelets 150 - 440 K/uL 188    No images are attached to the encounter.  Nm Myocar Multi W/spect W/wall Motion / Ef  Result Date: 08/16/2016  There was no ST segment deviation noted during stress.  No T wave inversion was noted during stress.  The study is normal.  This is a low risk study.  The left ventricular ejection fraction is normal (55-65%).      Assessment and plan- Patient is a 76 y.o. male referred to [place] for thrombocytopenia  1. Thrombocytopenia- only 1 abnormal value of 138. On repeat check and previous values over 1 year WNL. No further intervention at this point. No need to take molasses.   2. Mild normocytic anemia- anemia work up consistent with iron deficiency. I would recommend that patient should start taking oral iron 325 mg once or twice daily. If anemia worsens or  heremains iron deficienct, will refer to GI. His last  colonoscopy/ EGD patient states fwas about 7-8 years ago  Repeat cbc with diff in 3 months. I will see him in 6 months with cbc and   Visit Diagnosis 1. Thrombocytopenia ([place])   2. Iron deficiency anemia, unspecified iron deficiency anemia type      Dr. Randa Evens, MD, MPH [place] at [facility] Pager- [phone] 09/09/2016 2:10 PM

## 2016-09-23 ENCOUNTER — Telehealth: Payer: Self-pay | Admitting: Cardiovascular Disease

## 2016-09-23 NOTE — Telephone Encounter (Signed)
Cardiac clearance request for 7/5 colonoscopy and EGD w/[facility] placed in MD basket

## 2016-10-04 NOTE — Telephone Encounter (Signed)
Faxed clearance for 7/5 colonoscopy to [facility], [phone]

## 2016-10-14 ENCOUNTER — Ambulatory Visit: Payer: Medicare Other | Admitting: Anesthesiology

## 2016-10-14 ENCOUNTER — Ambulatory Visit
Admission: RE | Admit: 2016-10-14 | Discharge: 2016-10-14 | Disposition: A | Discharge status: Home / Self Care | Payer: Medicare Other | Source: Ambulatory Visit | Attending: Unknown Physician Specialty | Admitting: Unknown Physician Specialty

## 2016-10-14 ENCOUNTER — Encounter
Admission: RE | Disposition: A | Discharge status: Home / Self Care | Payer: Self-pay | Source: Ambulatory Visit | Attending: Unknown Physician Specialty

## 2016-10-14 ENCOUNTER — Encounter: Payer: Self-pay | Admitting: *Deleted

## 2016-10-14 DIAGNOSIS — I482 Chronic atrial fibrillation: Secondary | ICD-10-CM | POA: Insufficient documentation

## 2016-10-14 DIAGNOSIS — I2510 Atherosclerotic heart disease of native coronary artery without angina pectoris: Secondary | ICD-10-CM | POA: Insufficient documentation

## 2016-10-14 DIAGNOSIS — Z7984 Long term (current) use of oral hypoglycemic drugs: Secondary | ICD-10-CM | POA: Insufficient documentation

## 2016-10-14 DIAGNOSIS — I10 Essential (primary) hypertension: Secondary | ICD-10-CM | POA: Diagnosis not present

## 2016-10-14 DIAGNOSIS — E119 Type 2 diabetes mellitus without complications: Secondary | ICD-10-CM | POA: Insufficient documentation

## 2016-10-14 DIAGNOSIS — K449 Diaphragmatic hernia without obstruction or gangrene: Secondary | ICD-10-CM | POA: Insufficient documentation

## 2016-10-14 DIAGNOSIS — I4892 Unspecified atrial flutter: Secondary | ICD-10-CM | POA: Diagnosis not present

## 2016-10-14 DIAGNOSIS — D128 Benign neoplasm of rectum: Secondary | ICD-10-CM | POA: Diagnosis not present

## 2016-10-14 DIAGNOSIS — Z79899 Other long term (current) drug therapy: Secondary | ICD-10-CM | POA: Insufficient documentation

## 2016-10-14 DIAGNOSIS — Z951 Presence of aortocoronary bypass graft: Secondary | ICD-10-CM | POA: Insufficient documentation

## 2016-10-14 DIAGNOSIS — I471 Supraventricular tachycardia: Secondary | ICD-10-CM | POA: Insufficient documentation

## 2016-10-14 DIAGNOSIS — D509 Iron deficiency anemia, unspecified: Secondary | ICD-10-CM | POA: Insufficient documentation

## 2016-10-14 DIAGNOSIS — Z7902 Long term (current) use of antithrombotics/antiplatelets: Secondary | ICD-10-CM | POA: Insufficient documentation

## 2016-10-14 DIAGNOSIS — K219 Gastro-esophageal reflux disease without esophagitis: Secondary | ICD-10-CM | POA: Diagnosis not present

## 2016-10-14 DIAGNOSIS — D123 Benign neoplasm of transverse colon: Secondary | ICD-10-CM | POA: Diagnosis not present

## 2016-10-14 DIAGNOSIS — E785 Hyperlipidemia, unspecified: Secondary | ICD-10-CM | POA: Diagnosis not present

## 2016-10-14 DIAGNOSIS — K640 First degree hemorrhoids: Secondary | ICD-10-CM | POA: Insufficient documentation

## 2016-10-14 DIAGNOSIS — D125 Benign neoplasm of sigmoid colon: Secondary | ICD-10-CM | POA: Diagnosis not present

## 2016-10-14 DIAGNOSIS — D120 Benign neoplasm of cecum: Secondary | ICD-10-CM | POA: Insufficient documentation

## 2016-10-14 HISTORY — PX: COLONOSCOPY WITH PROPOFOL: SHX5780

## 2016-10-14 HISTORY — PX: ESOPHAGOGASTRODUODENOSCOPY (EGD) WITH PROPOFOL: SHX5813

## 2016-10-14 LAB — GLUCOSE, CAPILLARY: Glucose-Capillary: 114 mg/dL — ABNORMAL HIGH (ref 65–99)

## 2016-10-14 SURGERY — ESOPHAGOGASTRODUODENOSCOPY (EGD) WITH PROPOFOL
Anesthesia: General

## 2016-10-14 MED ORDER — LIDOCAINE HCL (CARDIAC) 20 MG/ML IV SOLN
INTRAVENOUS | Status: DC | PRN
  Administered 2016-10-14: 30 mg via INTRAVENOUS

## 2016-10-14 MED ORDER — PROPOFOL 500 MG/50ML IV EMUL
INTRAVENOUS | Status: AC
  Filled 2016-10-14: qty 50

## 2016-10-14 MED ORDER — SODIUM CHLORIDE 0.9 % IV SOLN
INTRAVENOUS | Status: DC
  Administered 2016-10-14: 08:00:00 via INTRAVENOUS

## 2016-10-14 MED ORDER — MIDAZOLAM HCL 2 MG/2ML IJ SOLN
INTRAMUSCULAR | Status: AC
  Filled 2016-10-14: qty 2

## 2016-10-14 MED ORDER — PHENYLEPHRINE HCL 10 MG/ML IJ SOLN
INTRAMUSCULAR | Status: DC | PRN
  Administered 2016-10-14: 25 ug via INTRAVENOUS
  Administered 2016-10-14: 50 ug via INTRAVENOUS

## 2016-10-14 MED ORDER — FENTANYL CITRATE (PF) 100 MCG/2ML IJ SOLN
INTRAMUSCULAR | Status: DC | PRN
Start: 2016-10-14 — End: 2016-10-14
  Administered 2016-10-14: 25 ug via INTRAVENOUS
  Administered 2016-10-14: 50 ug via INTRAVENOUS
  Administered 2016-10-14: 25 ug via INTRAVENOUS

## 2016-10-14 MED ORDER — PROPOFOL 500 MG/50ML IV EMUL
INTRAVENOUS | Status: DC | PRN
  Administered 2016-10-14: 120 ug/kg/min via INTRAVENOUS

## 2016-10-14 MED ORDER — ATENOLOL 25 MG PO TABS
ORAL_TABLET | ORAL | Status: AC
  Administered 2016-10-14: 50 mg via ORAL
  Filled 2016-10-14: qty 2

## 2016-10-14 MED ORDER — FENTANYL CITRATE (PF) 100 MCG/2ML IJ SOLN
INTRAMUSCULAR | Status: AC
  Filled 2016-10-14: qty 2

## 2016-10-14 MED ORDER — EPHEDRINE SULFATE 50 MG/ML IJ SOLN
INTRAMUSCULAR | Status: DC | PRN
  Administered 2016-10-14: 10 mg via INTRAVENOUS

## 2016-10-14 MED ORDER — SODIUM CHLORIDE 0.9 % IV SOLN: INTRAVENOUS | Status: DC

## 2016-10-14 MED ORDER — ATENOLOL 25 MG PO TABS
50.0000 mg | ORAL_TABLET | Freq: Once | ORAL | Status: AC
  Administered 2016-10-14: 50 mg via ORAL

## 2016-10-14 MED ORDER — MIDAZOLAM HCL 2 MG/2ML IJ SOLN
INTRAMUSCULAR | Status: DC | PRN
  Administered 2016-10-14: 2 mg via INTRAVENOUS

## 2016-10-14 MED ORDER — GLYCOPYRROLATE 0.2 MG/ML IJ SOLN
INTRAMUSCULAR | Status: DC | PRN
  Administered 2016-10-14: 0.1 mg via INTRAVENOUS

## 2016-10-14 NOTE — Op Note (Signed)
[facility] Patient Name: Walter Mosley Procedure Date: 10/14/2016 8:26 AM MRN: [id number] Account #: [id number] Date of Birth: [birth year] Admit Type: Outpatient Age: 76 Room: [facility] ENDO ROOM 1 Gender: Male Note Status: Finalized Procedure:            Upper GI endoscopy Indications:          Unexplained iron deficiency anemia Providers:            Manya Silvas, MD Referring MD:         No Local Md, MD (Referring MD) Medicines:            Propofol per Anesthesia Complications:        No immediate complications. Procedure:            Pre-Anesthesia Assessment:                       - After reviewing the risks and benefits, the patient                        was deemed in satisfactory condition to undergo the                        procedure.                       After obtaining informed consent, the endoscope was                        passed under direct vision. Throughout the procedure,                        the patient's blood pressure, pulse, and oxygen                        saturations were monitored continuously. The Endoscope                        was introduced through the mouth, and advanced to the                        second part of duodenum. The upper GI endoscopy was                        accomplished without difficulty. The patient tolerated                        the procedure well. Findings:      The examined esophagus was normal. GEJ 40cm.      A small-medium-sized hiatal hernia was present.      The stomach was otherwise normal.      The examined duodenum was normal. Impression:           - Normal esophagus.                       - Small-Medium-sized hiatal hernia.                       - Normal stomach.                       - Normal examined duodenum.                       -  No specimens collected. Recommendation:       - Perform a colonoscopy as previously scheduled. Manya Silvas, MD 10/14/2016 8:36:54 AM This  report has been signed electronically. Number of Addenda: 0 Note Initiated On: 10/14/2016 8:26 AM      [facility]

## 2016-10-14 NOTE — Anesthesia Postprocedure Evaluation (Signed)
Anesthesia Post Note  Patient: Behr Cislo.  Procedure(s) Performed: Procedure(s) (LRB): ESOPHAGOGASTRODUODENOSCOPY (EGD) WITH PROPOFOL (N/A) COLONOSCOPY WITH PROPOFOL (N/A)  Patient location during evaluation: Endoscopy Anesthesia Type: General Level of consciousness: awake and alert Pain management: pain level controlled Vital Signs Assessment: post-procedure vital signs reviewed and stable Respiratory status: spontaneous breathing and respiratory function stable Cardiovascular status: stable Anesthetic complications: no     Last Vitals:  Vitals:   10/14/16 0940 10/14/16 0950  BP: (!) 90/58 (!) 88/55  Pulse: 72 67  Resp: 11 11  Temp:      Last Pain:  Vitals:   10/14/16 0936  TempSrc: Tympanic                 Gisell Buehrle K

## 2016-10-14 NOTE — H&P (Signed)
Primary Care Physician:  Theotis Burrow, MD Primary Gastroenterologist:  Dr. Vira Agar  Pre-Procedure History & Physical: HPI:  Walter Suchy. is a 76 y.o. male is here for an endoscopy and colonoscopy.   Past Medical History:  Diagnosis Date  . Atrial flutter ([place])    s/p ablation  . Cancer (HCC)    squamous cell carcimnoma right leg  . Chronic atrial fibrillation (HCC)    a. CHA2DS2VASc = 5-->eliquis 5 bid. (h/o epistaxis and gingival bleeding on xarelto).  . Coronary artery disease    a. 1993 s/p CABG;  b. 08/2016 Lexi MV: no ischemia/infarct->low risk. EF 55-65%.  . Diabetes mellitus without complication ([place])   . Dyspnea on exertion    a. 08/2016 Echo: EF 55-60%, no rwma, mild MR, mildly dil LA/RA, mild MR.  [name] GERD (gastroesophageal reflux disease)   . Hyperlipidemia    Mixed  . Hypertension    Unspecified  . 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 Osteoarthritis, knee   . PAT (paroxysmal atrial tachycardia) ([place])   . PSVT (paroxysmal supraventricular tachycardia) ([place])     Past Surgical History:  Procedure Laterality Date  . CARDIAC CATHETERIZATION  1993  . CARDIAC ELECTROPHYSIOLOGY STUDY AND ABLATION    . CARDIAC SURGERY     by pass  . CORONARY ARTERY BYPASS GRAFT  1993   CABG x 5 @ [place]  . HEMORRHOID SURGERY    . MOHS SURGERY      Prior to Admission medications   Medication Sig Start Date End Date Taking? Authorizing Provider  amLODipine (NORVASC) 10 MG tablet Take 10 mg by mouth daily.    Yes [provider]  apixaban (ELIQUIS) 5 MG TABS tablet Take 1 tablet (5 mg total) by mouth 2 (two) times daily. 12/10/15  Yes Wellington Hampshire, MD  FERROUS SULFATE PO Take by mouth.   Yes [provider]  atenolol (TENORMIN) 50 MG tablet Take 50 mg by mouth daily.     [provider]  atorvastatin (LIPITOR) 80 MG tablet Take 80 mg by mouth daily.     [provider]  ezetimibe (ZETIA) 10 MG tablet Take 1  tablet (10 mg total) by mouth daily. Patient taking differently: Take 10 mg by mouth every evening.  01/17/15   Wellington Hampshire, MD  hydrochlorothiazide (HYDRODIURIL) 25 MG tablet Take 25 mg by mouth daily.     [provider]  metFORMIN (GLUCOPHAGE) 500 MG tablet Take 500 mg by mouth 2 (two) times daily with a meal.    [provider]  Omega-3 Fatty Acids (FISH OIL) 1200 MG CAPS Take 1,200 mg by mouth 2 (two) times daily.    [provider]  potassium chloride (K-DUR) 10 MEQ tablet Take 20 mEq by mouth every evening.     [provider]    Allergies as of 10/08/2016  . (No Known Allergies)    Family History  Problem Relation Age of Onset  . Heart disease Father   . Heart attack Father 22       MI  . Coronary artery disease Other     Social History   Social History  . Marital status: Married    Spouse name: N/A  . Number of children: N/A  . Years of education: N/A   Occupational History  . Retired    Social History Main Topics  . Smoking status: Never Smoker  . Smokeless tobacco: Never Used  .  Alcohol use No  . Drug use: No  . Sexual activity: Not on file   Other Topics Concern  . Not on file   Social History Narrative   Gets regular exercise.    Review of Systems: See HPI, otherwise negative ROS  Physical Exam: BP 139/85   Pulse 99   Temp 97.9 F (36.6 C) (Oral)   Resp 16   Ht 6\' 4"  (1.93 m)   Wt 88 kg (194 lb)   SpO2 98%   BMI 23.61 kg/m  General:   Alert,  pleasant and cooperative in NAD Head:  Normocephalic and atraumatic. Neck:  Supple; no masses or thyromegaly. Lungs:  Clear throughout to auscultation.    Heart:  Regular rate and rhythm. Abdomen:  Soft, nontender and nondistended. Normal bowel sounds, without guarding, and without rebound.   Neurologic:  Alert and  oriented x4;  grossly normal neurologically.  Impression/Plan: Walter Flor. is here for an endoscopy and colonoscopy to be performed for iron  def anemia and melena.  Risks, benefits, limitations, and alternatives regarding  endoscopy and colonoscopy have been reviewed with the patient.  Questions have been answered.  All parties agreeable.   Gaylyn Cheers, MD  10/14/2016, 8:22 AM

## 2016-10-14 NOTE — Transfer of Care (Signed)
Immediate Anesthesia Transfer of Care Note  Patient: Walter Mosley.  Procedure(s) Performed: Procedure(s): ESOPHAGOGASTRODUODENOSCOPY (EGD) WITH PROPOFOL (N/A) COLONOSCOPY WITH PROPOFOL (N/A)  Patient Location: PACU and ICU  Anesthesia Type:General  Level of Consciousness: awake and sedated  Airway & Oxygen Therapy: Patient Spontanous Breathing  Post-op Assessment: Report given to RN and Post -op Vital signs reviewed and stable  Post vital signs: Reviewed  Last Vitals:  Vitals:   10/14/16 0740  BP: 139/85  Pulse: 99  Resp: 16  Temp: 36.6 C    Last Pain:  Vitals:   10/14/16 0740  TempSrc: Oral      Patients Stated Pain Goal: 7 (35/36/14 4315)  Complications: No apparent anesthesia complications

## 2016-10-14 NOTE — Anesthesia Post-op Follow-up Note (Cosign Needed)
Anesthesia QCDR form completed.        

## 2016-10-14 NOTE — Anesthesia Preprocedure Evaluation (Signed)
Anesthesia Evaluation  Patient identified by MRN, date of birth, ID band Patient awake    Reviewed: Allergy & Precautions, NPO status , Patient's Chart, lab work & pertinent test results, reviewed documented beta blocker date and time   History of Anesthesia Complications Negative for: history of anesthetic complications  Airway Mallampati: II       Dental   Pulmonary neg pulmonary ROS,           Cardiovascular hypertension, Pt. on medications and Pt. on home beta blockers + CAD and + CABG  + dysrhythmias Atrial Fibrillation      Neuro/Psych negative neurological ROS     GI/Hepatic Neg liver ROS, GERD  Medicated,  Endo/Other  diabetes, Type 2, Oral Hypoglycemic Agents  Renal/GU negative Renal ROS     Musculoskeletal   Abdominal   Peds  Hematology negative hematology ROS (+)   Anesthesia Other Findings   Reproductive/Obstetrics                             Anesthesia Physical Anesthesia Plan  ASA: III  Anesthesia Plan: General   Post-op Pain Management:    Induction:   PONV Risk Score and Plan:   Airway Management Planned: Nasal Cannula  Additional Equipment:   Intra-op Plan:   Post-operative Plan:   Informed Consent: I have reviewed the patients History and Physical, chart, labs and discussed the procedure including the risks, benefits and alternatives for the proposed anesthesia with the patient or authorized representative who has indicated his/her understanding and acceptance.     Plan Discussed with:   Anesthesia Plan Comments:         Anesthesia Quick Evaluation

## 2016-10-14 NOTE — Transfer of Care (Signed)
Immediate Anesthesia Transfer of Care Note  Patient: Walter Mosley.  Procedure(s) Performed: Procedure(s): ESOPHAGOGASTRODUODENOSCOPY (EGD) WITH PROPOFOL (N/A) COLONOSCOPY WITH PROPOFOL (N/A)  Patient Location: PACU  Anesthesia Type:General  Level of Consciousness: awake and sedated  Airway & Oxygen Therapy: Patient Spontanous Breathing and Patient connected to nasal cannula oxygen  Post-op Assessment: Report given to RN and Post -op Vital signs reviewed and stable  Post vital signs: Reviewed and stable  Last Vitals:  Vitals:   10/14/16 0740  BP: 139/85  Pulse: 99  Resp: 16  Temp: 36.6 C    Last Pain:  Vitals:   10/14/16 0740  TempSrc: Oral      Patients Stated Pain Goal: 7 (37/09/64 3838)  Complications: No apparent anesthesia complications

## 2016-10-14 NOTE — Anesthesia Procedure Notes (Signed)
Performed by: COOK-MARTIN, Caresse Sedivy Pre-anesthesia Checklist: Patient identified, Emergency Drugs available, Suction available, Patient being monitored and Timeout performed Patient Re-evaluated:Patient Re-evaluated prior to inductionOxygen Delivery Method: Nasal cannula Preoxygenation: Pre-oxygenation with 100% oxygen Intubation Type: IV induction Airway Equipment and Method: Bite block Placement Confirmation: positive ETCO2 and CO2 detector     

## 2016-10-14 NOTE — Op Note (Signed)
[facility] Patient Name: Walter Mosley Procedure Date: 10/14/2016 8:26 AM MRN: [id number] Account #: [id number] Date of Birth: 06-19-1940 Admit Type: Outpatient Age: 76 Room: [facility] ENDO ROOM 1 Gender: Male Note Status: Finalized Procedure:            Colonoscopy Indications:          Unexplained iron deficiency anemia Providers:            Manya Silvas, MD Referring MD:         No Local Md, MD (Referring MD) Medicines:            Propofol per Anesthesia Complications:        No immediate complications. Procedure:            Pre-Anesthesia Assessment:                       - After reviewing the risks and benefits, the patient                        was deemed in satisfactory condition to undergo the                        procedure.                       After obtaining informed consent, the colonoscope was                        passed under direct vision. Throughout the procedure,                        the patient's blood pressure, pulse, and oxygen                        saturations were monitored continuously. The                        Colonoscope was introduced through the anus and                        advanced to the the cecum, identified by appendiceal                        orifice and ileocecal valve. The colonoscopy was                        performed without difficulty. The patient tolerated the                        procedure well. The quality of the bowel preparation                        was excellent. Findings:      Eight sessile polyps were found in the rectum, transverse colon and       cecum. The polyps were small in size. These polyps were removed with a       hot snare. Resection and retrieval were complete.      A 25 mm polyp was found in the sigmoid colon. The polyp was       pedunculated. The polyp was removed with a hot snare. Resection  and       retrieval were complete. To prevent bleeding after the polypectomy,  one       hemostatic clip was successfully placed. There was no bleeding during,       or at the end, of the procedure.      Internal hemorrhoids were found during endoscopy. The hemorrhoids were       medium-sized and Grade I (internal hemorrhoids that do not prolapse).      Cecum reached with certainty but photo not done. Impression:           - Eight small polyps in the rectum, in the transverse                        colon and in the cecum, removed with a hot snare.                        Resected and retrieved.                       - One 25 mm polyp in the sigmoid colon, removed with a                        hot snare. Resected and retrieved. Clip was placed.                       - Internal hemorrhoids. Recommendation:       - Await pathology results. Manya Silvas, MD 10/14/2016 9:33:24 AM This report has been signed electronically. Number of Addenda: 0 Note Initiated On: 10/14/2016 8:26 AM Scope Withdrawal Time: 0 hours 28 minutes 53 seconds  Total Procedure Duration: 0 hours 49 minutes 29 seconds       [facility]

## 2016-10-15 LAB — SURGICAL PATHOLOGY

## 2016-10-19 ENCOUNTER — Encounter: Payer: Self-pay | Admitting: Unknown Physician Specialty

## 2016-10-21 ENCOUNTER — Telehealth: Payer: Self-pay | Admitting: Cardiovascular Disease

## 2016-10-21 NOTE — Telephone Encounter (Signed)
Pt calling asking when is he to restart his Eliquis   Please advise.

## 2016-10-21 NOTE — Telephone Encounter (Signed)
Spoke with patient. He wanted to know when to restart Eliquis. He tried call Dr Elliott's office earlier today and has not heard back. Patient said he went ahead and restarted it on Tuesday. Advised patient we usually defer restart of blood thinner to the physician performing the procedure.  He verbalized understanding and will await call back from Dr [employer] office. He will call us if he has any further questions or concerns.

## 2016-10-24 ENCOUNTER — Encounter: Payer: Self-pay | Admitting: Emergency Medicine

## 2016-10-24 ENCOUNTER — Observation Stay
Admission: EM | Admit: 2016-10-24 | Discharge: 2016-10-25 | Disposition: A | Discharge status: Home / Self Care | Payer: Medicare Other | Attending: Internal Medicine | Admitting: Internal Medicine

## 2016-10-24 DIAGNOSIS — I10 Essential (primary) hypertension: Secondary | ICD-10-CM | POA: Insufficient documentation

## 2016-10-24 DIAGNOSIS — E119 Type 2 diabetes mellitus without complications: Secondary | ICD-10-CM | POA: Insufficient documentation

## 2016-10-24 DIAGNOSIS — Z7901 Long term (current) use of anticoagulants: Secondary | ICD-10-CM | POA: Insufficient documentation

## 2016-10-24 DIAGNOSIS — Z8614 Personal history of Methicillin resistant Staphylococcus aureus infection: Secondary | ICD-10-CM | POA: Insufficient documentation

## 2016-10-24 DIAGNOSIS — I471 Supraventricular tachycardia: Secondary | ICD-10-CM | POA: Diagnosis not present

## 2016-10-24 DIAGNOSIS — K922 Gastrointestinal hemorrhage, unspecified: Secondary | ICD-10-CM

## 2016-10-24 DIAGNOSIS — Z7984 Long term (current) use of oral hypoglycemic drugs: Secondary | ICD-10-CM | POA: Diagnosis not present

## 2016-10-24 DIAGNOSIS — Z951 Presence of aortocoronary bypass graft: Secondary | ICD-10-CM | POA: Diagnosis not present

## 2016-10-24 DIAGNOSIS — I482 Chronic atrial fibrillation: Secondary | ICD-10-CM | POA: Insufficient documentation

## 2016-10-24 DIAGNOSIS — K921 Melena: Secondary | ICD-10-CM | POA: Diagnosis not present

## 2016-10-24 DIAGNOSIS — Z79899 Other long term (current) drug therapy: Secondary | ICD-10-CM | POA: Insufficient documentation

## 2016-10-24 DIAGNOSIS — E785 Hyperlipidemia, unspecified: Secondary | ICD-10-CM | POA: Insufficient documentation

## 2016-10-24 DIAGNOSIS — I2510 Atherosclerotic heart disease of native coronary artery without angina pectoris: Secondary | ICD-10-CM | POA: Insufficient documentation

## 2016-10-24 LAB — COMPREHENSIVE METABOLIC PANEL
ALBUMIN: 4.2 g/dL (ref 3.5–5.0)
ALT: 16 U/L — ABNORMAL LOW (ref 17–63)
AST: 21 U/L (ref 15–41)
Alkaline Phosphatase: 49 U/L (ref 38–126)
Anion gap: 8 (ref 5–15)
BUN: 22 mg/dL — AB (ref 6–20)
CHLORIDE: 102 mmol/L (ref 101–111)
CO2: 27 mmol/L (ref 22–32)
Calcium: 8.7 mg/dL — ABNORMAL LOW (ref 8.9–10.3)
Creatinine, Ser: 0.98 mg/dL (ref 0.61–1.24)
GFR calc Af Amer: >60 mL/min (ref >60)
GFR calc non Af Amer: >60 mL/min (ref >60)
GLUCOSE: 211 mg/dL — AB (ref 65–99)
POTASSIUM: 4.1 mmol/L (ref 3.5–5.1)
Sodium: 137 mmol/L (ref 135–145)
Total Bilirubin: 1.2 mg/dL (ref 0.3–1.2)
Total Protein: 6.5 g/dL (ref 6.5–8.1)

## 2016-10-24 LAB — CBC
HEMATOCRIT: 30.9 % — AB (ref 40.0–52.0)
Hemoglobin: 10.6 g/dL — ABNORMAL LOW (ref 13.0–18.0)
MCH: 28.5 pg (ref 26.0–34.0)
MCHC: 34.2 g/dL (ref 32.0–36.0)
MCV: 83.5 fL (ref 80.0–100.0)
Platelets: 182 10*3/uL (ref 150–440)
RBC: 3.7 MIL/uL — ABNORMAL LOW (ref 4.40–5.90)
RDW: 14.6 % — AB (ref 11.5–14.5)
WBC: 8.2 10*3/uL (ref 3.8–10.6)

## 2016-10-24 LAB — PROTIME-INR
INR: 1.12
Prothrombin Time: 14.5 seconds (ref 11.4–15.2)

## 2016-10-24 LAB — TYPE AND SCREEN
ABO/RH(D): O POS
Antibody Screen: NEGATIVE

## 2016-10-24 LAB — MRSA PCR SCREENING: MRSA BY PCR: NEGATIVE

## 2016-10-24 LAB — TROPONIN I: Troponin I: <0.03 ng/mL (ref <0.03)

## 2016-10-24 MED ORDER — ACETAMINOPHEN 325 MG PO TABS: 650.0000 mg | ORAL_TABLET | Freq: Four times a day (QID) | ORAL | Status: DC | PRN

## 2016-10-24 MED ORDER — HYDROCHLOROTHIAZIDE 25 MG PO TABS
25.0000 mg | ORAL_TABLET | Freq: Every day | ORAL | Status: DC
  Administered 2016-10-25: 25 mg via ORAL
  Filled 2016-10-24: qty 1

## 2016-10-24 MED ORDER — ONDANSETRON HCL 4 MG/2ML IJ SOLN
4.0000 mg | Freq: Four times a day (QID) | INTRAMUSCULAR | Status: DC | PRN
Start: 2016-10-24 — End: 2016-10-25

## 2016-10-24 MED ORDER — ATENOLOL 50 MG PO TABS
50.0000 mg | ORAL_TABLET | Freq: Every day | ORAL | Status: DC
  Administered 2016-10-25: 50 mg via ORAL
  Filled 2016-10-24: qty 1

## 2016-10-24 MED ORDER — ATORVASTATIN CALCIUM 20 MG PO TABS: 80.0000 mg | ORAL_TABLET | Freq: Every evening | ORAL | Status: DC

## 2016-10-24 MED ORDER — AMLODIPINE BESYLATE 10 MG PO TABS
10.0000 mg | ORAL_TABLET | Freq: Every day | ORAL | Status: DC
  Administered 2016-10-25: 10 mg via ORAL
  Filled 2016-10-24: qty 1

## 2016-10-24 MED ORDER — METFORMIN HCL 500 MG PO TABS
500.0000 mg | ORAL_TABLET | Freq: Two times a day (BID) | ORAL | Status: DC
  Administered 2016-10-25: 500 mg via ORAL
  Filled 2016-10-24: qty 1

## 2016-10-24 MED ORDER — FERROUS SULFATE 325 (65 FE) MG PO TABS
325.0000 mg | ORAL_TABLET | Freq: Every day | ORAL | Status: DC
  Administered 2016-10-24 – 2016-10-25 (×2): 325 mg via ORAL
  Filled 2016-10-24 (×2): qty 1

## 2016-10-24 MED ORDER — ACETAMINOPHEN 650 MG RE SUPP: 650.0000 mg | Freq: Four times a day (QID) | RECTAL | Status: DC | PRN

## 2016-10-24 MED ORDER — EZETIMIBE 10 MG PO TABS
10.0000 mg | ORAL_TABLET | Freq: Every evening | ORAL | Status: DC
  Filled 2016-10-24 (×2): qty 1

## 2016-10-24 MED ORDER — ONDANSETRON HCL 4 MG PO TABS: 4.0000 mg | ORAL_TABLET | Freq: Four times a day (QID) | ORAL | Status: DC | PRN

## 2016-10-24 NOTE — H&P (Signed)
[facility] [place] NAME: Walter Mosley    MR#:  [id number]  DATE OF BIRTH:  Jan 10, 1941  DATE OF ADMISSION:  10/24/2016  PRIMARY CARE PHYSICIAN: Theotis Burrow, MD   REQUESTING/REFERRING PHYSICIAN: Dr. Quentin Cornwall  CHIEF COMPLAINT:  Diarrhea with black colored stools  HISTORY OF PRESENT ILLNESS:  Walter Mosley  is a 76 y.o. male with a known history of Atrial fibrillation status post ablation on chronic anticoagulation, history of coronary artery disease comes to the emergency room after he started noticing black colored diarrheal stools yesterday and today. He also noticed some dark maroon blood clots in the commode. Patient had colonoscopy on 10/14/2016 by Dr. Vira Agar and about 8-9 sessile polyps were removed throughout the colon. Patient resumed his eliquis back on 10/17/2016. He tells me he was not given any instructions after the procedure and he left a few messages with the GI clinic however did not hear from them and decided to start his anticoagulation on his own.  Patient denies any abdominal pain His baseline hemoglobin is 12.6----hemoglobin today is 10.6. ER M.D. spoke with Dr. Michail Sermon GI on call recommended overnight observation.  PAST MEDICAL HISTORY:   Past Medical History:  Diagnosis Date  . Atrial flutter ([place])    s/p ablation  . Cancer (HCC)    squamous cell carcimnoma right leg  . Chronic atrial fibrillation (HCC)    a. CHA2DS2VASc = 5-->eliquis 5 bid. (h/o epistaxis and gingival bleeding on xarelto).  . Coronary artery disease    a. 1993 s/p CABG;  b. 08/2016 Lexi MV: no ischemia/infarct->low risk. EF 55-65%.  . Diabetes mellitus without complication ([place])   . Dyspnea on exertion    a. 08/2016 Echo: EF 55-60%, no rwma, mild MR, mildly dil LA/RA, mild MR.  [name] GERD (gastroesophageal reflux disease)   . Hyperlipidemia    Mixed  . Hypertension    Unspecified  . MRSA (methicillin resistant staph aureus) culture  positive   . Osteoarthritis, knee   . PAT (paroxysmal atrial tachycardia) ([place])   . PSVT (paroxysmal supraventricular tachycardia) ([place])     PAST SURGICAL HISTOIRY:   Past Surgical History:  Procedure Laterality Date  . CARDIAC CATHETERIZATION  1993  . CARDIAC ELECTROPHYSIOLOGY STUDY AND ABLATION    . CARDIAC SURGERY     by pass  . COLONOSCOPY WITH PROPOFOL N/A 10/14/2016   Procedure: COLONOSCOPY WITH PROPOFOL;  Surgeon: Manya Silvas, MD;  Location[facility] ENDOSCOPY;  Service: Endoscopy;  Laterality: N/A;  . CORONARY ARTERY BYPASS GRAFT  1993   CABG x 5 @ [place]  . ESOPHAGOGASTRODUODENOSCOPY (EGD) WITH PROPOFOL N/A 10/14/2016   Procedure: ESOPHAGOGASTRODUODENOSCOPY (EGD) WITH PROPOFOL;  Surgeon: Manya Silvas, MD;  Location: [facility] ENDOSCOPY;  Service: Endoscopy;  Laterality: N/A;  . HEMORRHOID SURGERY    . MOHS SURGERY      SOCIAL HISTORY:   Social History  Substance Use Topics  . Smoking status: Never Smoker  . Smokeless tobacco: Never Used  . Alcohol use No    FAMILY HISTORY:   Family History  Problem Relation Age of Onset  . Heart disease Father   . Heart attack Father 56       MI  . Coronary artery disease Other     DRUG ALLERGIES:  No Known Allergies  REVIEW OF SYSTEMS:  Review of Systems  Constitutional: Negative for chills, fever and weight loss.  HENT: Negative for ear discharge, ear pain and nosebleeds.  Eyes: Negative for blurred vision, pain and discharge.  Respiratory: Negative for sputum production, shortness of breath, wheezing and stridor.   Cardiovascular: Negative for chest pain, palpitations, orthopnea and PND.  Gastrointestinal: Positive for melena. Negative for abdominal pain, diarrhea, nausea and vomiting.  Genitourinary: Negative for frequency and urgency.  Musculoskeletal: Negative for back pain and joint pain.  Neurological: Positive for weakness. Negative for sensory change, speech change and focal weakness.  Psychiatric/Behavioral:  Negative for depression and hallucinations. The patient is not nervous/anxious.      MEDICATIONS AT HOME:   Prior to Admission medications   Medication Sig Start Date End Date Taking? Authorizing Provider  amLODipine (NORVASC) 10 MG tablet Take 10 mg by mouth daily.    Yes [provider]  apixaban (ELIQUIS) 5 MG TABS tablet Take 1 tablet (5 mg total) by mouth 2 (two) times daily. 12/10/15  Yes Wellington Hampshire, MD  atenolol (TENORMIN) 50 MG tablet Take 50 mg by mouth daily.    Yes [provider]  atorvastatin (LIPITOR) 80 MG tablet Take 80 mg by mouth daily.    Yes [provider]  ezetimibe (ZETIA) 10 MG tablet Take 1 tablet (10 mg total) by mouth daily. Patient taking differently: Take 10 mg by mouth every evening.  01/17/15  Yes Wellington Hampshire, MD  ferrous sulfate 325 (65 FE) MG tablet Take 1 tablet by mouth daily.    Yes [provider]  hydrochlorothiazide (HYDRODIURIL) 25 MG tablet Take 25 mg by mouth daily.    Yes [provider]  metFORMIN (GLUCOPHAGE) 500 MG tablet Take 500 mg by mouth 2 (two) times daily with a meal.   Yes [provider]  Potassium Chloride ER 20 MEQ TBCR Take 20 mEq by mouth 2 (two) times daily.    Yes [provider]      VITAL SIGNS:  Blood pressure 118/63, pulse 77, temperature 98.1 F (36.7 C), temperature source Oral, resp. rate 18, height 6\' 4"  (1.93 m), weight 88 kg (194 lb), SpO2 99 %.  PHYSICAL EXAMINATION:  GENERAL:  76 y.o.-year-old patient lying in the bed with no acute distress.  EYES: Pupils equal, round, reactive to light and accommodation. No scleral icterus. Extraocular muscles intact.  HEENT: Head atraumatic, normocephalic. Oropharynx and nasopharynx clear.  NECK:  Supple, no jugular venous distention. No thyroid enlargement, no tenderness.  LUNGS: Normal breath sounds bilaterally, no wheezing, rales,rhonchi or crepitation. No use of accessory muscles of respiration.   CARDIOVASCULAR: S1, S2 normal. No murmurs, rubs, or gallops.  ABDOMEN: Soft, nontender, nondistended. Bowel sounds present. No organomegaly or mass.  EXTREMITIES: No pedal edema, cyanosis, or clubbing.  NEUROLOGIC: Cranial nerves II through XII are intact. Muscle strength 5/5 in all extremities. Sensation intact. Gait not checked.  PSYCHIATRIC: The patient is alert and oriented x 3.  SKIN: No obvious rash, lesion, or ulcer.   LABORATORY PANEL:   CBC  Recent Labs Lab 10/24/16 1430  WBC 8.2  HGB 10.6*  HCT 30.9*  PLT 182   ------------------------------------------------------------------------------------------------------------------  Chemistries   Recent Labs Lab 10/24/16 1430  NA 137  K 4.1  CL 102  CO2 27  GLUCOSE 211*  BUN 22*  CREATININE 0.98  CALCIUM 8.7*  AST 21  ALT 16*  ALKPHOS 49  BILITOT 1.2   ------------------------------------------------------------------------------------------------------------------  Cardiac Enzymes No results for input(s): TROPONINI in the last 168 hours. ------------------------------------------------------------------------------------------------------------------  RADIOLOGY:  No results found.  EKG:    IMPRESSION AND PLAN:  Walter Mosley  is a 76 y.o. male with a known history of Atrial fibrillation status post ablation on chronic anticoagulation, history of coronary artery disease comes to the emergency room after he started noticing black colored diarrheal stools yesterday and today. He also noticed some dark maroon blood clots in the commode  1. Melena -Patient recently had colonoscopy 10/14/2016 by Dr. Vira Agar with removal of 8-9 suicidal polyp different size. Patient resumed his oral anticoagulation 3 days after the procedure since he was not given any instructions regarding the same now comes in with melanotic diarrheal stools. -Admit for overnight observation -Regular diet -Hold oral anticoagulation -Follow up  hemoglobin in the morning. Discussed with Dr. Vira Agar further plan if patient continues to have melanotic/maroonish stools -Patient also had EGD on 10/14/2016 which was normal  2. Chronic A. Fib -Rate controlled -Hold anticoagulation  3. Hyperlipidemia continue atorvastatin  4. Diabetes -Resume Glucophage and sliding scale insulin  5. DVT prophylaxis SCD and TEDS  All the records are reviewed and case discussed with ED provider. Management plans discussed with the patient, family and they are in agreement.  CODE STATUS:full  TOTAL TIME TAKING CARE OF THIS PATIENT: 45 minutes.    Cherylann Hobday M.D on 10/24/2016 at 4:17 PM  Between 7am to 6pm - Pager - [phone]  After 6pm go to [email or web address] - password [email or web address] [facility]  Office  [phone]  CC: Primary care physician; Theotis Burrow, MD

## 2016-10-24 NOTE — ED Triage Notes (Signed)
Pt had colonoscopy a little over week ago for polyp removal and started eliquis back Monday.  Has had black stools since them. Skin color WNL.  Skin warm and dry. VSS.  NAD.

## 2016-10-24 NOTE — ED Provider Notes (Signed)
[facility] Emergency Department Provider Note    First MD Initiated Contact with Patient 10/24/16 1519     (approximate)  I have reviewed the triage vital signs and the nursing notes.   HISTORY  Chief Complaint Melena    HPI Walter Mosley. is a 76 y.o. male with a history of A. fib on eliquis presents with 48 hours of intermittent melena and maroon-colored stools after restarting his L Oquist this past Monday. Patient had endoscopy and colonoscopy done with Dr. Vira Agar early last week. Endoscopy was normal but the patient had 8 polyps removed. He is otherwise doing well and restarted himself on the eliquis.  States he does have intermittent epigastric discomfort. Does not have any lightheadedness or fatigue. No nausea or vomiting.   Past Medical History:  Diagnosis Date  . Atrial flutter ([place])    s/p ablation  . Cancer (HCC)    squamous cell carcimnoma right leg  . Chronic atrial fibrillation (HCC)    a. CHA2DS2VASc = 5-->eliquis 5 bid. (h/o epistaxis and gingival bleeding on xarelto).  . Coronary artery disease    a. 1993 s/p CABG;  b. 08/2016 Lexi MV: no ischemia/infarct->low risk. EF 55-65%.  . Diabetes mellitus without complication ([place])   . Dyspnea on exertion    a. 08/2016 Echo: EF 55-60%, no rwma, mild MR, mildly dil LA/RA, mild MR.  [name] GERD (gastroesophageal reflux disease)   . Hyperlipidemia    Mixed  . Hypertension    Unspecified  . 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 Osteoarthritis, knee   . PAT (paroxysmal atrial tachycardia) ([place])   . PSVT (paroxysmal supraventricular tachycardia) (HCC)    Family History  Problem Relation Age of Onset  . Heart disease Father   . Heart attack Father 15       MI  . Coronary artery disease Other    Past Surgical History:  Procedure Laterality Date  . CARDIAC CATHETERIZATION  1993  . CARDIAC ELECTROPHYSIOLOGY STUDY AND ABLATION    . CARDIAC SURGERY     by pass  .  COLONOSCOPY WITH PROPOFOL N/A 10/14/2016   Procedure: COLONOSCOPY WITH PROPOFOL;  Surgeon: Manya Silvas, MD;  Location: [facility] ENDOSCOPY;  Service: Endoscopy;  Laterality: N/A;  . CORONARY ARTERY BYPASS GRAFT  1993   CABG x 5 @ [place]  . ESOPHAGOGASTRODUODENOSCOPY (EGD) WITH PROPOFOL N/A 10/14/2016   Procedure: ESOPHAGOGASTRODUODENOSCOPY (EGD) WITH PROPOFOL;  Surgeon: Manya Silvas, MD;  Location: [facility] ENDOSCOPY;  Service: Endoscopy;  Laterality: N/A;  . HEMORRHOID SURGERY    . MOHS SURGERY     Patient Active Problem List   Diagnosis Date Noted  . Abscess   . Cellulitis 02/11/2015  . Chronic atrial fibrillation ([place]) 12/31/2013  . Pre-syncope 11/16/2012  . Coronary artery disease   . Hyperlipidemia 01/29/2009  . Essential hypertension 01/29/2009  . CAD, ARTERY BYPASS GRAFT 01/29/2009  . SVT/ PSVT/ PAT 01/29/2009  . ATRIAL FLUTTER 01/29/2009      Prior to Admission medications   Medication Sig Start Date End Date Taking? Authorizing Provider  amLODipine (NORVASC) 10 MG tablet Take 10 mg by mouth daily.     [provider]  apixaban (ELIQUIS) 5 MG TABS tablet Take 1 tablet (5 mg total) by mouth 2 (two) times daily. 12/10/15   Wellington Hampshire, MD  atenolol (TENORMIN) 50 MG tablet Take 50 mg by mouth daily.     [provider]  atorvastatin (LIPITOR) 80  MG tablet Take 80 mg by mouth daily.     [provider]  ezetimibe (ZETIA) 10 MG tablet Take 1 tablet (10 mg total) by mouth daily. Patient taking differently: Take 10 mg by mouth every evening.  01/17/15   Wellington Hampshire, MD  FERROUS SULFATE PO Take by mouth.    [provider]  hydrochlorothiazide (HYDRODIURIL) 25 MG tablet Take 25 mg by mouth daily.     [provider]  metFORMIN (GLUCOPHAGE) 500 MG tablet Take 500 mg by mouth 2 (two) times daily with a meal.    [provider]  Omega-3 Fatty Acids (FISH OIL) 1200 MG CAPS Take 1,200 mg by mouth 2 (two) times daily.     [provider]  potassium chloride (K-DUR) 10 MEQ tablet Take 20 mEq by mouth every evening.     [provider]    Allergies Patient has no known allergies.    Social History Social History  Substance Use Topics  . Smoking status: Never Smoker  . Smokeless tobacco: Never Used  . Alcohol use No    Review of Systems Patient denies headaches, rhinorrhea, blurry vision, numbness, shortness of breath, chest pain, edema, cough, abdominal pain, nausea, vomiting, diarrhea, dysuria, fevers, rashes or hallucinations unless otherwise stated above in HPI. ____________________________________________   PHYSICAL EXAM:  VITAL SIGNS: Vitals:   10/24/16 1431  BP: 118/63  Pulse: 77  Resp: 18  Temp: 98.1 F (36.7 C)    Constitutional: Alert and oriented. Well appearing and in no acute distress. Eyes: Conjunctivae are normal.  Head: Atraumatic. Nose: No congestion/rhinnorhea. Mouth/Throat: Mucous membranes are moist.   Neck: No stridor. Painless ROM.  Cardiovascular: Normal rate, regular rhythm. Grossly normal heart sounds.  Good peripheral circulation. Respiratory: Normal respiratory effort.  No retractions. Lungs CTAB. Gastrointestinal: Soft and nontender. No distention. No abdominal bruits. No CVA tenderness. Musculoskeletal: No lower extremity tenderness nor edema.  No joint effusions. Neurologic:  Normal speech and language. No gross focal neurologic deficits are appreciated. No facial droop Skin:  Skin is warm, dry and intact. No rash noted. Psychiatric: Mood and affect are normal. Speech and behavior are normal.  ____________________________________________   LABS (all labs ordered are listed, but only abnormal results are displayed)  Results for orders placed or performed during the hospital encounter of 10/24/16 (from the past 24 hour(s))  Comprehensive metabolic panel     Status: Abnormal   Collection Time: 10/24/16  2:30 PM  Result Value Ref Range    Sodium 137 135 - 145 mmol/L   Potassium 4.1 3.5 - 5.1 mmol/L   Chloride 102 101 - 111 mmol/L   CO2 27 22 - 32 mmol/L   Glucose, Bld 211 (H) 65 - 99 mg/dL   BUN 22 (H) 6 - 20 mg/dL   Creatinine, Ser 0.98 0.61 - 1.24 mg/dL   Calcium 8.7 (L) 8.9 - 10.3 mg/dL   Total Protein 6.5 6.5 - 8.1 g/dL   Albumin 4.2 3.5 - 5.0 g/dL   AST 21 15 - 41 U/L   ALT 16 (L) 17 - 63 U/L   Alkaline Phosphatase 49 38 - 126 U/L   Total Bilirubin 1.2 0.3 - 1.2 mg/dL   GFR calc non Af Amer >60 >60 mL/min   GFR calc Af Amer >60 >60 mL/min   Anion gap 8 5 - 15  CBC     Status: Abnormal   Collection Time: 10/24/16  2:30 PM  Result Value Ref Range  WBC 8.2 3.8 - 10.6 K/uL   RBC 3.70 (L) 4.40 - 5.90 MIL/uL   Hemoglobin 10.6 (L) 13.0 - 18.0 g/dL   HCT 30.9 (L) 40.0 - 52.0 %   MCV 83.5 80.0 - 100.0 fL   MCH 28.5 26.0 - 34.0 pg   MCHC 34.2 32.0 - 36.0 g/dL   RDW 14.6 (H) 11.5 - 14.5 %   Platelets 182 150 - 440 K/uL  Type and screen [facility]     Status: None   Collection Time: 10/24/16  2:30 PM  Result Value Ref Range   ABO/RH(D) O POS    Antibody Screen NEG    Sample Expiration 10/27/2016    ____________________________________________  EKG My review and personal interpretation at Time: 15:51   Indication: melena  Rate: 70  Rhythm: sinus Axis: normal Other: non specific st changes, no stemi ____________________________________________  RADIOLOGY   ____________________________________________   PROCEDURES  Procedure(s) performed:  Procedures    Critical Care performed: no ____________________________________________   INITIAL IMPRESSION / ASSESSMENT AND PLAN / ED COURSE  Pertinent labs & imaging results that were available during my care of the patient were reviewed by me and considered in my medical decision making (see chart for details).  DDX: ugi bleed, lower gi bleed, gastritis, supratherapeutic anticoagulation, ABL anemia  Walter Mosley. is a 76 y.o. who  presents to the ED with roughly 36-48 hours of intermittent maroon colored to melanotic stools status post restarting his L Oquist after colonoscopy with polyp removal area and his abdominal exam is soft and benign. Patient does have mild drop in his hemoglobin from previous but is otherwise normotensive.  A spoke with Dr. Michail Sermon of GI who recommends observation in the ER for serial hemoglobins and consultation in the morning. As recommended holding eliquis.  There is some presentation will speak with hospitalist regarding admission for further hemodynamic monitoring.  Have discussed with the patient and available family all diagnostics and treatments performed thus far and all questions were answered to the best of my ability. The patient demonstrates understanding and agreement with plan.       ____________________________________________   FINAL CLINICAL IMPRESSION(S) / ED DIAGNOSES  Final diagnoses:  Melena      NEW MEDICATIONS STARTED DURING THIS VISIT:  New Prescriptions   No medications on file     Note:  This document was prepared using Dragon voice recognition software and may include unintentional dictation errors.    Merlyn Lot, MD 10/24/16 [phone]

## 2016-10-25 DIAGNOSIS — K922 Gastrointestinal hemorrhage, unspecified: Secondary | ICD-10-CM

## 2016-10-25 LAB — CBC
HCT: 29.2 % — ABNORMAL LOW (ref 40.0–52.0)
Hemoglobin: 10.1 g/dL — ABNORMAL LOW (ref 13.0–18.0)
MCH: 28.6 pg (ref 26.0–34.0)
MCHC: 34.6 g/dL (ref 32.0–36.0)
MCV: 82.6 fL (ref 80.0–100.0)
Platelets: 154 10*3/uL (ref 150–440)
RBC: 3.54 MIL/uL — ABNORMAL LOW (ref 4.40–5.90)
RDW: 14.9 % — AB (ref 11.5–14.5)
WBC: 6.2 10*3/uL (ref 3.8–10.6)

## 2016-10-25 LAB — HEMOGLOBIN: Hemoglobin: 10 g/dL — ABNORMAL LOW (ref 13.0–18.0)

## 2016-10-25 MED ORDER — MELATONIN 5 MG PO TABS
2.5000 mg | ORAL_TABLET | Freq: Every day | ORAL | Status: DC
  Filled 2016-10-25 (×2): qty 0.5

## 2016-10-25 NOTE — Progress Notes (Signed)
Inpatient Diabetes Program Recommendations  AACE/ADA: New Consensus Statement on Inpatient Glycemic Control (2015)  Target Ranges:  Prepandial:   less than 140 mg/dL      Peak postprandial:   less than 180 mg/dL (1-2 hours)      Critically ill patients:  140 - 180 mg/dL   Results for JAGAR, LUA (MRN [id number]) as of 10/25/2016 08:38  Ref. Range 10/24/2016 14:30  Glucose Latest Ref Range: 65 - 99 mg/dL 211 (H)   Review of Glycemic Control  Diabetes history: DM2 Outpatient Diabetes medications: Metformin 500 mg BID Current orders for Inpatient glycemic control: Metformin 500 mg BID  Inpatient Diabetes Program Recommendations: Correction (SSI): While inpatient, please consider ordering CBGs with Novolog correction scale ACHS.  Thanks, Barnie Alderman, RN, MSN, CDE Diabetes Coordinator Inpatient Diabetes Program [phone] (Team Pager from 8am to 5pm)

## 2016-10-25 NOTE — Progress Notes (Signed)
Spoke with Dr.Willis about patient request for something to sleep. Patient stated he takes 3 mg melatonin tablet at home. Dr.Willis gave orders to order melatonin per patient dosage at home. Walter Mosley

## 2016-10-25 NOTE — Discharge Instructions (Addendum)
° °  Gastrointestinal Bleeding Gastrointestinal bleeding is bleeding somewhere along the path food travels through the body (digestive tract). This path is anywhere between the mouth and the opening of the butt (anus). You may have blood in your poop (stools) or have black poop. If you throw up (vomit), there may be blood in it. This condition can be mild, serious, or even life-threatening. If you have a lot of bleeding, you may need to stay in the hospital. Follow these instructions at home:  Take over-the-counter and prescription medicines only as told by your doctor.  Eat foods that have a lot of fiber in them. These foods include whole grains, fruits, and vegetables. You can also try eating 1-3 prunes each day.  Drink enough fluid to keep your pee (urine) clear or pale yellow.  Keep all follow-up visits as told by your doctor. This is important. Contact a doctor if:  Your symptoms do not get better. Get help right away if:  Your bleeding gets worse.  You feel dizzy or you pass out (faint).  You feel weak.  You have very bad cramps in your back or belly (abdomen).  You pass large clumps of blood (clots) in your poop.  Your symptoms are getting worse. This information is not intended to replace advice given to you by your health care provider. Make sure you discuss any questions you have with your health care provider. Document Released: 01/06/2008 Document Revised: 09/04/2015 Document Reviewed: 09/16/2014 Elsevier Interactive Patient Education  2018 [place] [facility] [place]:  Cardiac diet  DISCHARGE CONDITION:  Stable  ACTIVITY:  Activity as tolerated  OXYGEN:  Home Oxygen: No.   Oxygen Delivery: room air  DISCHARGE LOCATION:  home    ADDITIONAL DISCHARGE INSTRUCTION: dont take eliquis until ok'd by gi   If you experience worsening of your admission symptoms, develop shortness of breath, life threatening  emergency, suicidal or homicidal thoughts you must seek medical attention immediately by calling 911 or calling your MD immediately  if symptoms less severe.  You Must read complete instructions/literature along with all the possible adverse reactions/side effects for all the Medicines you take and that have been prescribed to you. Take any new Medicines after you have completely understood and accpet all the possible adverse reactions/side effects.   Please note  You were cared for by a hospitalist during your hospital stay. If you have any questions about your discharge medications or the care you received while you were in the hospital after you are discharged, you can call the unit and asked to speak with the hospitalist on call if the hospitalist that took care of you is not available. Once you are discharged, your primary care physician will handle any further medical issues. Please note that NO REFILLS for any discharge medications will be authorized once you are discharged, as it is imperative that you return to your primary care physician (or establish a relationship with a primary care physician if you do not have one) for your aftercare needs so that they can reassess your need for medications and monitor your lab values.

## 2016-10-25 NOTE — Progress Notes (Signed)
10/25/2016  4:47 PM  Walter Mosley. to be D/C'd Home per MD order.  Discussed prescriptions and follow up appointments with the patient. Prescriptions given to patient, medication list explained in detail. Pt verbalized understanding.  Allergies as of 10/25/2016   No Known Allergies     Medication List    STOP taking these medications   apixaban 5 MG Tabs tablet Commonly known as:  ELIQUIS     TAKE these medications   amLODipine 10 MG tablet Commonly known as:  NORVASC Take 10 mg by mouth daily.   atenolol 50 MG tablet Commonly known as:  TENORMIN Take 50 mg by mouth daily.   atorvastatin 80 MG tablet Commonly known as:  LIPITOR Take 80 mg by mouth daily.   ezetimibe 10 MG tablet Commonly known as:  ZETIA Take 1 tablet (10 mg total) by mouth daily. What changed:  when to take this   ferrous sulfate 325 (65 FE) MG tablet Take 1 tablet by mouth daily.   hydrochlorothiazide 25 MG tablet Commonly known as:  HYDRODIURIL Take 25 mg by mouth daily.   metFORMIN 500 MG tablet Commonly known as:  GLUCOPHAGE Take 500 mg by mouth 2 (two) times daily with a meal.   Potassium Chloride ER 20 MEQ Tbcr Take 20 mEq by mouth 2 (two) times daily.       Vitals:   10/25/16 0509 10/25/16 1315  BP: 118/60 128/65  Pulse: 68 66  Resp: 17 16  Temp: 97.9 F (36.6 C) 98.2 F (36.8 C)    Skin clean, dry and intact without evidence of skin break down, no evidence of skin tears noted. IV catheter discontinued intact. Site without signs and symptoms of complications. Dressing and pressure applied. Pt denies pain at this time. No complaints noted.  An After Visit Summary was printed and given to the patient. Patient escorted via [place], and D/C home via private auto.  Walter Mosley

## 2016-10-25 NOTE — Progress Notes (Signed)
10/25/2016  5:37 PM  Pt's wife called the unit after discharge because pt couldn't find his watch.  Located the watch and notified pt and family.  Watch is at nurse's station awaiting pt pick-up.  Dola Argyle, RN

## 2016-10-25 NOTE — Care Management Obs Status (Signed)
[facility] NOTIFICATION   Patient Details  Name: Walter Mosley. MRN: [id number] Date of Birth: August 01, 1940   Medicare Observation Status Notification Given:  No (Admitted less than 24 hours)    Beverly Sessions, RN 10/25/2016, 12:11 PM

## 2016-10-25 NOTE — Consult Note (Signed)
[name], MD [facility]  [address], [place], [place] [address] Phone: [phone] Fax : [phone]  Consultation  Referring Provider:     Dr. Posey Pronto Primary Care Physician:  Alene Mires Elyse Jarvis, MD Primary Gastroenterologist:  Dr. Vira Agar         Reason for Consultation:     GI bleed  Date of Admission:  10/24/2016 Date of Consultation:  10/25/2016         HPI:   Walter Carpenter. is a 76 y.o. male who had a colonoscopy on July 5 with the removal of 8 polyps throughout his colon with a sigmoid colon polyp that was reported to be 25 mm. The patient states he was trying to contact his gastroenterologist who did not return his phone calls so he restarted his blood thinner which she takes for an irregular heart rate. The patient then started to have a large amount of dark stools. The patient's baseline hemoglobin 5 months ago was 14 with a hemoglobin drop 1 month ago to 12.9. The patient's admission hemoglobin was 10.6 with his morning blood draw today showing a hemoglobin of 10.1. The patient hasn't taken his blood thinner for 3 days now. There is no report of any abdominal pain nausea vomiting fevers or chills. The patient also reports that he has not had any further signs of any active GI bleeding. He has passed some small amount of dark material but without any fresh blood.  Past Medical History:  Diagnosis Date  . Atrial flutter ([place])    s/p ablation  . Cancer (HCC)    squamous cell carcimnoma right leg  . Chronic atrial fibrillation (HCC)    a. CHA2DS2VASc = 5-->eliquis 5 bid. (h/o epistaxis and gingival bleeding on xarelto).  . Coronary artery disease    a. 1993 s/p CABG;  b. 08/2016 Lexi MV: no ischemia/infarct->low risk. EF 55-65%.  . Diabetes mellitus without complication ([place])   . Dyspnea on exertion    a. 08/2016 Echo: EF 55-60%, no rwma, mild MR, mildly dil LA/RA, mild MR.  [name] GERD (gastroesophageal reflux disease)   . Hyperlipidemia    Mixed  . Hypertension      Unspecified  . 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 Osteoarthritis, knee   . PAT (paroxysmal atrial tachycardia) ([place])   . PSVT (paroxysmal supraventricular tachycardia) ([place])     Past Surgical History:  Procedure Laterality Date  . CARDIAC CATHETERIZATION  1993  . CARDIAC ELECTROPHYSIOLOGY STUDY AND ABLATION    . CARDIAC SURGERY     by pass  . COLONOSCOPY WITH PROPOFOL N/A 10/14/2016   Procedure: COLONOSCOPY WITH PROPOFOL;  Surgeon: Manya Silvas, MD;  Location: [facility] ENDOSCOPY;  Service: Endoscopy;  Laterality: N/A;  . CORONARY ARTERY BYPASS GRAFT  1993   CABG x 5 @ [place]  . ESOPHAGOGASTRODUODENOSCOPY (EGD) WITH PROPOFOL N/A 10/14/2016   Procedure: ESOPHAGOGASTRODUODENOSCOPY (EGD) WITH PROPOFOL;  Surgeon: Manya Silvas, MD;  Location: [facility] ENDOSCOPY;  Service: Endoscopy;  Laterality: N/A;  . HEMORRHOID SURGERY    . MOHS SURGERY      Prior to Admission medications   Medication Sig Start Date End Date Taking? Authorizing Provider  amLODipine (NORVASC) 10 MG tablet Take 10 mg by mouth daily.    Yes [provider]  apixaban (ELIQUIS) 5 MG TABS tablet Take 1 tablet (5 mg total) by mouth 2 (two) times daily. 12/10/15  Yes Wellington Hampshire, MD  atenolol (TENORMIN) 50 MG  tablet Take 50 mg by mouth daily.    Yes [provider]  atorvastatin (LIPITOR) 80 MG tablet Take 80 mg by mouth daily.    Yes [provider]  ezetimibe (ZETIA) 10 MG tablet Take 1 tablet (10 mg total) by mouth daily. Patient taking differently: Take 10 mg by mouth every evening.  01/17/15  Yes Wellington Hampshire, MD  ferrous sulfate 325 (65 FE) MG tablet Take 1 tablet by mouth daily.    Yes [provider]  hydrochlorothiazide (HYDRODIURIL) 25 MG tablet Take 25 mg by mouth daily.    Yes [provider]  metFORMIN (GLUCOPHAGE) 500 MG tablet Take 500 mg by mouth 2 (two) times daily with a meal.   Yes [provider]  Potassium Chloride ER  20 MEQ TBCR Take 20 mEq by mouth 2 (two) times daily.    Yes [provider]    Family History  Problem Relation Age of Onset  . Heart disease Father   . Heart attack Father 18       MI  . Coronary artery disease Other      Social History  Substance Use Topics  . Smoking status: Never Smoker  . Smokeless tobacco: Never Used  . Alcohol use No    Allergies as of 10/24/2016  . (No Known Allergies)    Review of Systems:    All systems reviewed and negative except where noted in HPI.   Physical Exam:  Vital signs in last 24 hours: Temp:  [97.7 F (36.5 C)-98.2 F (36.8 C)] 98.2 F (36.8 C) (07/16 1315) Pulse Rate:  [66-75] 66 (07/16 1315) Resp:  [16-18] 16 (07/16 1315) BP: (118-131)/(60-73) 128/65 (07/16 1315) SpO2:  [95 %-100 %] 98 % (07/16 1315) Weight:  [185 lb (83.9 kg)] 185 lb (83.9 kg) (07/15 1800) Last BM Date: 10/25/16 General:   Pleasant, cooperative in NAD Head:  Normocephalic and atraumatic. Eyes:   No icterus.   Conjunctiva pink. PERRLA. Ears:  Normal auditory acuity. Neck:  Supple; no masses or thyroidomegaly Lungs: Respirations even and unlabored. Lungs clear to auscultation bilaterally.   No wheezes, crackles, or rhonchi.  Heart:  Regular rate and rhythm;  Without murmur, clicks, rubs or gallops Abdomen:  Soft, nondistended, nontender. Normal bowel sounds. No appreciable masses or hepatomegaly.  No rebound or guarding.  Rectal:  Not performed. Msk:  Symmetrical without gross deformities.    Extremities:  Without edema, cyanosis or clubbing. Neurologic:  Alert and oriented x3;  grossly normal neurologically. Skin:  Intact without significant lesions or rashes. Cervical Nodes:  No significant cervical adenopathy. Psych:  Alert and cooperative. Normal affect.  LAB RESULTS:  Recent Labs  10/24/16 1430 10/25/16 0447  WBC 8.2 6.2  HGB 10.6* 10.1*  HCT 30.9* 29.2*  PLT 182 154   BMET  Recent Labs  10/24/16 1430  NA 137  K 4.1  CL 102    CO2 27  GLUCOSE 211*  BUN 22*  CREATININE 0.98  CALCIUM 8.7*   LFT  Recent Labs  10/24/16 1430  PROT 6.5  ALBUMIN 4.2  AST 21  ALT 16*  ALKPHOS 49  BILITOT 1.2   PT/INR  Recent Labs  10/24/16 1538  LABPROT 14.5  INR 1.12    STUDIES: No results found.    Impression / Plan:   Walter Carpenter. is a 76 y.o. y/o male with Who comes in to the hospital with a post-polypectomy bleed. The patient had an upper endoscopy  and colonoscopy with 8 polyps that were reported to be removed from the colon without any description of the size of these polyps. The patient did have a 25 mm polyp in the sigmoid colon that was removed and from the note it appears that there were Endo clips placed at that time. It is more likely that the patient bled because of his blood thinner and if possible the patient should remain off the blood thinners for the next 5 days. Will plan to do any endoscopy or colonoscopy this time due to the patient not having any active bleeding and recently having an EGD and colonoscopy. The patient has been explained the plan and agrees with it.   Thank you for involving me in the care of this patient.      LOS: 0 days   [name], MD  10/25/2016, 3:57 PM   Note: This dictation was prepared with Dragon dictation along with smaller phrase technology. Any transcriptional errors that result from this process are unintentional.

## 2016-10-26 NOTE — Discharge Summary (Signed)
[place] [facility] 76 y.o., DOB 1940-05-18, MRN [id number]. Admission date: 10/24/2016 Discharge Date 10/26/2016 Primary MD [name], Elyse Jarvis, MD Admitting Physician Fritzi Mandes, MD  Admission Diagnosis  Melena [K92.1]  Discharge Diagnosis   Active Problems:   Melena   Lower GI bleed suspect due to recent polypectomy  Atrial fibrillation Coronary artery disease Diabetes type 2 GERD Hyperlipidemia Hypertension       Hospital Course  Cheikh Bramble. is a 76 y.o. y/o male with Who comes in to the hospital with a post-polypectomy bleed. The patient had an upper endoscopy and colonoscopy with 8 polyps that were reported to be removed from the colon without any description of the size of these polyps. Patient had multiple polyps removed. He is on eliquis which she will restart taking. He presented with bloody bowel movements. Therefore he was admitted and observed. His hemoglobins remained stable. Patient is recommended to hold his until he is seen by his GI doctor in the next 1-2 days. Also recommend a CBC check at the follow-up. Currently he is not having any symptoms.           Consults  GI  Significant Tests:  See full reports for all details     No results found.     Today   Subjective:   Frederik Schmidt  patient feels well denies any complaints  Objective:   Blood pressure 128/65, pulse 66, temperature 98.2 F (36.8 C), temperature source Oral, resp. rate 16, height 6\' 4"  (1.93 m), weight 185 lb (83.9 kg), SpO2 98 %.  . No intake or output data in the 24 hours ending 10/26/16 1541  Exam VITAL SIGNS: Blood pressure 128/65, pulse 66, temperature 98.2 F (36.8 C), temperature source Oral, resp. rate 16, height 6\' 4"  (1.93 m), weight 185 lb (83.9 kg), SpO2 98 %.  GENERAL:  76 y.o.-year-old patient lying in the bed with no acute distress.  EYES: Pupils equal, round, reactive to light and accommodation. No scleral  icterus. Extraocular muscles intact.  HEENT: Head atraumatic, normocephalic. Oropharynx and nasopharynx clear.  NECK:  Supple, no jugular venous distention. No thyroid enlargement, no tenderness.  LUNGS: Normal breath sounds bilaterally, no wheezing, rales,rhonchi or crepitation. No use of accessory muscles of respiration.  CARDIOVASCULAR: S1, S2 normal. No murmurs, rubs, or gallops.  ABDOMEN: Soft, nontender, nondistended. Bowel sounds present. No organomegaly or mass.  EXTREMITIES: No pedal edema, cyanosis, or clubbing.  NEUROLOGIC: Cranial nerves II through XII are intact. Muscle strength 5/5 in all extremities. Sensation intact. Gait not checked.  PSYCHIATRIC: The patient is alert and oriented x 3.  SKIN: No obvious rash, lesion, or ulcer.   Data Review     CBC w Diff:  Lab Results  Component Value Date   WBC 6.2 10/25/2016   HGB 10.0 (L) 10/25/2016   HGB 14.0 05/27/2016   HCT 29.2 (L) 10/25/2016   HCT 41.0 05/27/2016   PLT 154 10/25/2016   PLT 220 05/27/2016   LYMPHOPCT 27 08/31/2016   LYMPHOPCT 19.7 02/25/2013   MONOPCT 13 08/31/2016   MONOPCT 9.0 02/25/2013   EOSPCT 2 08/31/2016   EOSPCT 3.0 02/25/2013   BASOPCT 1 08/31/2016   BASOPCT 0.7 02/25/2013   CMP:  Lab Results  Component Value Date   NA 137 10/24/2016   NA 140 05/27/2016   NA 137 02/25/2013   K 4.1 10/24/2016   K 3.5 02/25/2013   CL 102 10/24/2016  CL 102 02/25/2013   CO2 27 10/24/2016   CO2 31 02/25/2013   BUN 22 (H) 10/24/2016   BUN 12 05/27/2016   BUN 17 02/25/2013   CREATININE 0.98 10/24/2016   CREATININE 1.12 02/25/2013   PROT 6.5 10/24/2016   PROT 7.1 05/27/2016   ALBUMIN 4.2 10/24/2016   ALBUMIN 4.6 05/27/2016   BILITOT 1.2 10/24/2016   BILITOT 0.8 05/27/2016   ALKPHOS 49 10/24/2016   AST 21 10/24/2016   ALT 16 (L) 10/24/2016  .  Micro Results Recent Results (from the past 240 hour(s))  MRSA PCR Screening     Status: None   Collection Time: 10/24/16  6:12 PM  Result Value Ref  Range Status   MRSA by PCR NEGATIVE NEGATIVE Final    Comment:        The GeneXpert MRSA Assay (FDA approved for NASAL specimens only), is one component of a comprehensive MRSA colonization surveillance program. It is not intended to diagnose MRSA infection nor to guide or monitor treatment for MRSA infections.      Code Status History    Date Active Date Inactive Code Status Order ID Comments User Context   10/24/2016  6:10 PM 10/25/2016  8:33 PM Full Code [id number]  Fritzi Mandes, MD Inpatient   02/11/2015  6:20 PM 02/15/2015  2:23 PM Full Code [id number]  Vaughan Basta, MD Inpatient          Follow-up Information    Manya Silvas, MD. Go on 11/11/2016.   Specialty:  Gastroenterology Why:  Dr. Vira Agar, Thursday, August 2, at 11 a.m., [phone], pt with recent colonscopy with gi bleed Contact information: [place] [address] [phone]        [name], Elyse Jarvis, MD. Go on 11/03/2016.   Specialty:  Family Medicine Why:  Dr. Ladoris Gene, July 25 at 1:20 p.m., [phone], check cbc at time of visit Contact information: [address] [place] [address] [phone]           Discharge Medications   Allergies as of 10/25/2016   No Known Allergies     Medication List    STOP taking these medications   apixaban 5 MG Tabs tablet Commonly known as:  ELIQUIS     TAKE these medications   amLODipine 10 MG tablet Commonly known as:  NORVASC Take 10 mg by mouth daily.   atenolol 50 MG tablet Commonly known as:  TENORMIN Take 50 mg by mouth daily.   atorvastatin 80 MG tablet Commonly known as:  LIPITOR Take 80 mg by mouth daily.   ezetimibe 10 MG tablet Commonly known as:  ZETIA Take 1 tablet (10 mg total) by mouth daily. What changed:  when to take this   ferrous sulfate 325 (65 FE) MG tablet Take 1 tablet by mouth daily.   hydrochlorothiazide 25 MG tablet Commonly known as:  HYDRODIURIL Take  25 mg by mouth daily.   metFORMIN 500 MG tablet Commonly known as:  GLUCOPHAGE Take 500 mg by mouth 2 (two) times daily with a meal.   Potassium Chloride ER 20 MEQ Tbcr Take 20 mEq by mouth 2 (two) times daily.          Total Time in preparing paper work, data evaluation and todays exam - 35 minutes  Dustin Flock M.D on 10/26/2016 at 3:41 PM  [facility]   Office  [phone]

## 2016-12-02 ENCOUNTER — Ambulatory Visit (INDEPENDENT_AMBULATORY_CARE_PROVIDER_SITE_OTHER): Payer: Medicare Other | Admitting: Cardiovascular Disease

## 2016-12-02 ENCOUNTER — Encounter: Payer: Self-pay | Admitting: Cardiovascular Disease

## 2016-12-02 VITALS — BP 142/80 | HR 52 | Ht 76.0 in | Wt 200.8 lb

## 2016-12-02 DIAGNOSIS — E785 Hyperlipidemia, unspecified: Secondary | ICD-10-CM

## 2016-12-02 DIAGNOSIS — I482 Chronic atrial fibrillation, unspecified: Secondary | ICD-10-CM

## 2016-12-02 DIAGNOSIS — I2510 Atherosclerotic heart disease of native coronary artery without angina pectoris: Secondary | ICD-10-CM | POA: Diagnosis not present

## 2016-12-02 DIAGNOSIS — I10 Essential (primary) hypertension: Secondary | ICD-10-CM | POA: Diagnosis not present

## 2016-12-02 NOTE — Patient Instructions (Signed)
Medication Instructions: Continue same medications.   Labwork: None.   Procedures/Testing: None.   Follow-Up: 6 months with Dr. Arida.   Any Additional Special Instructions Will Be Listed Below (If Applicable).     If you need a refill on your cardiac medications before your next appointment, please call your pharmacy.   

## 2016-12-02 NOTE — Progress Notes (Signed)
Cardiology Office Note   Date:  12/02/2016   ID:  Walter Bonura., DOB May 19, 1940, MRN [id number]  PCP:  Theotis Burrow, MD  Cardiologist:   Kathlyn Sacramento, MD   Chief Complaint  Patient presents with  . OTHER    3 month f/u no complaints today. Meds reviewed verbally with pt.      History of Present Illness: Walter Dery. is a 76 y.o. male who presents for a followup visit regarding coronary artery disease and chronic atrial fibrillation . He has a long history of coronary disease s/p bypass surgery in 1993. No ischemic cardiac events since then. He had atrial flutter in 2010  and underwent successful ablation by Dr. Lovena Le.   He had episodes of symptomatic bradycardia in 2014.  This improved after stopping Diltiazem .  He had worsening exertional dyspnea few months ago and was evaluated by an echocardiogram which showed normal LV systolic function with mild mitral and tricuspid regurgitation with no evidence of pulmonary hypertension. Nuclear stress test showed no evidence of ischemia or perfusion defects.  He was briefly hospitalized recently with GI bleed after polypectomy. 8 polyps were removed and he resumed Eliquis 3 days after. He did not require transfusion. He is back on Eliquis with no issues. He denies any chest pain or worsening dyspnea.  Past Medical History:  Diagnosis Date  . Atrial flutter ([place])    s/p ablation  . Cancer (HCC)    squamous cell carcimnoma right leg  . Chronic atrial fibrillation (HCC)    a. CHA2DS2VASc = 5-->eliquis 5 bid. (h/o epistaxis and gingival bleeding on xarelto).  . Coronary artery disease    a. 1993 s/p CABG;  b. 08/2016 Lexi MV: no ischemia/infarct->low risk. EF 55-65%.  . Diabetes mellitus without complication ([place])   . Dyspnea on exertion    a. 08/2016 Echo: EF 55-60%, no rwma, mild MR, mildly dil LA/RA, mild MR.  [name] GERD (gastroesophageal reflux disease)   . Hyperlipidemia    Mixed  . Hypertension    Unspecified  .  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 Osteoarthritis, knee   . PAT (paroxysmal atrial tachycardia) ([place])   . PSVT (paroxysmal supraventricular tachycardia) ([place])     Past Surgical History:  Procedure Laterality Date  . CARDIAC CATHETERIZATION  1993  . CARDIAC ELECTROPHYSIOLOGY STUDY AND ABLATION    . CARDIAC SURGERY     by pass  . COLONOSCOPY WITH PROPOFOL N/A 10/14/2016   Procedure: COLONOSCOPY WITH PROPOFOL;  Surgeon: Manya Silvas, MD;  Location: [facility] ENDOSCOPY;  Service: Endoscopy;  Laterality: N/A;  . CORONARY ARTERY BYPASS GRAFT  1993   CABG x 5 @ [place]  . ESOPHAGOGASTRODUODENOSCOPY (EGD) WITH PROPOFOL N/A 10/14/2016   Procedure: ESOPHAGOGASTRODUODENOSCOPY (EGD) WITH PROPOFOL;  Surgeon: Manya Silvas, MD;  Location: [facility] ENDOSCOPY;  Service: Endoscopy;  Laterality: N/A;  . HEMORRHOID SURGERY    . MOHS SURGERY       Current Outpatient Prescriptions  Medication Sig Dispense Refill  . amLODipine (NORVASC) 10 MG tablet Take 10 mg by mouth daily.     [name] apixaban (ELIQUIS) 5 MG TABS tablet Take 5 mg by mouth 2 (two) times daily.    [name] atenolol (TENORMIN) 50 MG tablet Take 50 mg by mouth daily.     [name] atorvastatin (LIPITOR) 80 MG tablet Take 80 mg by mouth daily.     [name] ezetimibe (ZETIA) 10 MG tablet Take 1 tablet (10 mg  total) by mouth daily. (Patient taking differently: Take 10 mg by mouth every evening. ) 30 tablet 5  . ferrous sulfate 325 (65 FE) MG tablet Take 1 tablet by mouth daily.     . hydrochlorothiazide (HYDRODIURIL) 25 MG tablet Take 25 mg by mouth daily.     . metFORMIN (GLUCOPHAGE) 500 MG tablet Take 500 mg by mouth 2 (two) times daily with a meal.    . Potassium Chloride ER 20 MEQ TBCR Take 20 mEq by mouth 2 (two) times daily.      No current facility-administered medications for this visit.     Allergies:   Xarelto [rivaroxaban]    Social History:  The patient  reports that he has never smoked. He has never used smokeless tobacco. He  reports that he does not drink alcohol or use drugs.   Family History:  The patient's family history includes Coronary artery disease in his other; Heart attack (age of onset: 39) in his father; Heart disease in his father.    ROS:  Please see the history of present illness.   Otherwise, review of systems are positive for none.   All other systems are reviewed and negative.    PHYSICAL EXAM: VS:  BP (!) 142/80 (BP Location: Left Arm, Patient Position: Sitting, Cuff Size: Normal)   Pulse (!) 52   Ht 6\' 4"  (1.93 m)   Wt 200 lb 12 oz (91.1 kg)   BMI 24.44 kg/m  , BMI Body mass index is 24.44 kg/m. GEN: Well nourished, well developed, in no acute distress  HEENT: normal  Neck: no JVD, carotid bruits, or masses Cardiac: Irregularly irregular; no murmurs, rubs, or gallops,no edema  Respiratory:  clear to auscultation bilaterally, normal work of breathing GI: soft, nontender, nondistended, + BS MS: no deformity or atrophy  Skin: warm and dry, no rash Neuro:  Strength and sensation are intact Psych: euthymic mood, full affect   EKG:  EKG is ordered today. The ekg ordered today demonstrates atrial fibrillation with ventricular rate of 52 bpm.   Recent Labs: 10/24/2016: ALT 16; BUN 22; Creatinine, Ser 0.98; Potassium 4.1; Sodium 137 10/25/2016: Hemoglobin 10.0; Platelets 154    Lipid Panel    Component Value Date/Time   CHOL 134 05/27/2016 0806   TRIG 116 05/27/2016 0806   HDL 32 (L) 05/27/2016 0806   CHOLHDL 4.2 05/27/2016 0806   CHOLHDL 6.0 CALC 06/24/2008 1202   VLDL 30 06/24/2008 1202   LDLCALC 79 05/27/2016 0806      Wt Readings from Last 3 Encounters:  12/02/16 200 lb 12 oz (91.1 kg)  10/24/16 185 lb (83.9 kg)  10/14/16 194 lb (88 kg)        ASSESSMENT AND PLAN:  1.  Chronic atrial fibrillation: He is overall asymptomatic. Ventricular rate is well controlled on current dose of atenolol. Continue anticoagulation with Eliquis.  2. Coronary artery disease  involving native coronary arteries without angina: He is overall doing well. Continue medical therapy.Stress test in May was normal.  3. Essential hypertension: Blood pressure is controlled on current medications.  4. Hyperlipidemia:  He is currently on maximal dose atorvastatin and Zetia. Most recent lipid profile showed an LDL of 79 which is close to target.   Disposition:   FU with me in 6 months  Signed,  Kathlyn Sacramento, MD  12/02/2016 11:41 AM    [place]

## 2016-12-09 ENCOUNTER — Inpatient Hospital Stay: Payer: Medicare Other

## 2017-01-05 ENCOUNTER — Inpatient Hospital Stay: Payer: Medicare Other | Attending: Oncology

## 2017-03-10 ENCOUNTER — Inpatient Hospital Stay (HOSPITAL_BASED_OUTPATIENT_CLINIC_OR_DEPARTMENT_OTHER): Payer: Medicare Other | Admitting: Oncology

## 2017-03-10 ENCOUNTER — Inpatient Hospital Stay: Payer: Medicare Other | Attending: Oncology

## 2017-03-10 ENCOUNTER — Encounter: Payer: Self-pay | Admitting: Oncology

## 2017-03-10 VITALS — BP 142/77 | HR 76 | Temp 97.5°F | Resp 16 | Wt 205.8 lb

## 2017-03-10 DIAGNOSIS — I10 Essential (primary) hypertension: Secondary | ICD-10-CM | POA: Insufficient documentation

## 2017-03-10 DIAGNOSIS — D509 Iron deficiency anemia, unspecified: Secondary | ICD-10-CM | POA: Insufficient documentation

## 2017-03-10 DIAGNOSIS — I2510 Atherosclerotic heart disease of native coronary artery without angina pectoris: Secondary | ICD-10-CM | POA: Insufficient documentation

## 2017-03-10 DIAGNOSIS — K219 Gastro-esophageal reflux disease without esophagitis: Secondary | ICD-10-CM | POA: Insufficient documentation

## 2017-03-10 DIAGNOSIS — E119 Type 2 diabetes mellitus without complications: Secondary | ICD-10-CM | POA: Diagnosis not present

## 2017-03-10 DIAGNOSIS — Z7901 Long term (current) use of anticoagulants: Secondary | ICD-10-CM

## 2017-03-10 DIAGNOSIS — I482 Chronic atrial fibrillation: Secondary | ICD-10-CM

## 2017-03-10 DIAGNOSIS — E785 Hyperlipidemia, unspecified: Secondary | ICD-10-CM | POA: Diagnosis not present

## 2017-03-10 DIAGNOSIS — D696 Thrombocytopenia, unspecified: Secondary | ICD-10-CM | POA: Insufficient documentation

## 2017-03-10 DIAGNOSIS — I4892 Unspecified atrial flutter: Secondary | ICD-10-CM

## 2017-03-10 DIAGNOSIS — Z79899 Other long term (current) drug therapy: Secondary | ICD-10-CM

## 2017-03-10 LAB — CBC
HEMATOCRIT: 42.5 % (ref 40.0–52.0)
HEMOGLOBIN: 14.5 g/dL (ref 13.0–18.0)
MCH: 29.4 pg (ref 26.0–34.0)
MCHC: 34.1 g/dL (ref 32.0–36.0)
MCV: 86.4 fL (ref 80.0–100.0)
Platelets: 170 10*3/uL (ref 150–440)
RBC: 4.92 MIL/uL (ref 4.40–5.90)
RDW: 14.5 % (ref 11.5–14.5)
WBC: 7 10*3/uL (ref 3.8–10.6)

## 2017-03-10 LAB — IRON AND TIBC
IRON: 62 ug/dL (ref 45–182)
Saturation Ratios: 19 % (ref 17.9–39.5)
TIBC: 334 ug/dL (ref 250–450)
UIBC: 272 ug/dL

## 2017-03-10 LAB — FOLATE: Folate: 13.9 ng/mL (ref >5.9)

## 2017-03-10 LAB — FERRITIN: Ferritin: 55 ng/mL (ref 24–336)

## 2017-03-10 NOTE — Progress Notes (Signed)
Patient here to follow up with labs today. He states that he is feeling well and denise having any pain.

## 2017-03-10 NOTE — Progress Notes (Signed)
Hematology/Oncology Consult note [facility]  Telephone:([phone] [phone]  Patient Care Team: Theotis Burrow, MD as PCP - General (Family Medicine)   Name of the patient: Walter Mosley  [id number]  Oct 10, 1940   Date of visit: 03/10/17  Diagnosis-iron deficiency anemia. Thrombocytopenia transient resolved  Chief complaint/ Reason for visit-routine follow-up of iron deficiency anemia and thrombocytopenia  Heme/Onc history: patient is a 76 year old male was then referred to [place] for thrombocytopenia. Patient has had multiple CBCs with [place] in the past including most recent one from February 2018 which showed a normal platelet count. Recent CBC that he had with his primary care doctor in May 2018 showed white count of 8.2, H&H of 12.8/37.2 and a platelet count of 134. Patient is currently on Eliquis for his afib there was concern for mild anemia and thrombocytopenia in the setting of Eliquis use and hence has been referred to [place] for the same. His CMP was within normal limits TSH was within normal limits and HIV hepatitis A, B, and C testing was also done was normal. Iron studies showed iron saturation of 13% and TIBC that was normal at 366.  Patient is currently not on any oral iron. He reports one episode of dark stools this morning but has not had any such episodes in the past. Denies any bright red blood in his stool  Further workup from 08/31/2016 was as follows: CBC showed white count of 4.8, H&H of 12.9/37.4 and a platelet count of 188. Peripheral smear review showed mild normocytic anemia without any morphological abnormalities of RBCs and WBCs. Mild thrombocytopenia with normal platelet morphology. Ferritin levels were low at 19. B12 and folate was within normal limits. Multiple myeloma panel showed no monoclonal protein. Haptoglobin was normal at 72    Interval history- patient is currently taking oral iron and tolerating it well without any  side effects.  Patient recently underwent an upper endoscopy and colonoscopy in July 2018.  Upper endoscopy showed evidence of Barrett's esophagus and colonoscopy showed multiple polyps.  He is due to get another colonoscopy in 1 years time  ECOG PS- 0 Pain scale- 0  Review of systems- Review of Systems  Constitutional: Negative for chills, fever, malaise/fatigue and weight loss.  HENT: Negative for congestion, ear discharge and nosebleeds.   Eyes: Negative for blurred vision.  Respiratory: Negative for cough, hemoptysis, sputum production, shortness of breath and wheezing.   Cardiovascular: Negative for chest pain, palpitations, orthopnea and claudication.  Gastrointestinal: Negative for abdominal pain, blood in stool, constipation, diarrhea, heartburn, melena, nausea and vomiting.  Genitourinary: Negative for dysuria, flank pain, frequency, hematuria and urgency.  Musculoskeletal: Negative for back pain, joint pain and myalgias.  Skin: Negative for rash.  Neurological: Negative for dizziness, tingling, focal weakness, seizures, weakness and headaches.  Endo/Heme/Allergies: Does not bruise/bleed easily.  Psychiatric/Behavioral: Negative for depression and suicidal ideas. The patient does not have insomnia.        Allergies  Allergen Reactions  . Xarelto [Rivaroxaban]     Other reaction(s): Other (See Comments) Bleeding gums Bleeding gums     Past Medical History:  Diagnosis Date  . Atrial flutter ([place])    s/p ablation  . Cancer (HCC)    squamous cell carcimnoma right leg  . Chronic atrial fibrillation (HCC)    a. CHA2DS2VASc = 5-->eliquis 5 bid. (h/o epistaxis and gingival bleeding on xarelto).  . Coronary artery disease    a. 1993 s/p CABG;  b. 08/2016 Lexi  MV: no ischemia/infarct->low risk. EF 55-65%.  . Diabetes mellitus without complication ([place])   . Dyspnea on exertion    a. 08/2016 Echo: EF 55-60%, no rwma, mild MR, mildly dil LA/RA, mild MR.  [name] GERD (gastroesophageal  reflux disease)   . Hyperlipidemia    Mixed  . Hypertension    Unspecified  . 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 Osteoarthritis, knee   . PAT (paroxysmal atrial tachycardia) ([place])   . PSVT (paroxysmal supraventricular tachycardia) ([place])      Past Surgical History:  Procedure Laterality Date  . CARDIAC CATHETERIZATION  1993  . CARDIAC ELECTROPHYSIOLOGY STUDY AND ABLATION    . CARDIAC SURGERY     by pass  . COLONOSCOPY WITH PROPOFOL N/A 10/14/2016   Procedure: COLONOSCOPY WITH PROPOFOL;  Surgeon: Manya Silvas, MD;  Location: [facility] ENDOSCOPY;  Service: Endoscopy;  Laterality: N/A;  . CORONARY ARTERY BYPASS GRAFT  1993   CABG x 5 @ [place]  . ESOPHAGOGASTRODUODENOSCOPY (EGD) WITH PROPOFOL N/A 10/14/2016   Procedure: ESOPHAGOGASTRODUODENOSCOPY (EGD) WITH PROPOFOL;  Surgeon: Manya Silvas, MD;  Location[facility] ENDOSCOPY;  Service: Endoscopy;  Laterality: N/A;  . HEMORRHOID SURGERY    . MOHS SURGERY      Social History   Socioeconomic History  . Marital status: Married    Spouse name: Not on file  . Number of children: Not on file  . Years of education: Not on file  . Highest education level: Not on file  Social Needs  . Financial resource strain: Not on file  . Food insecurity - worry: Not on file  . Food insecurity - inability: Not on file  . Transportation needs - medical: Not on file  . Transportation needs - non-medical: Not on file  Occupational History  . Occupation: Retired  Tobacco Use  . Smoking status: Never Smoker  . Smokeless tobacco: Never Used  Substance and Sexual Activity  . Alcohol use: No  . Drug use: No  . Sexual activity: Not on file  Other Topics Concern  . Not on file  Social History Narrative   Gets regular exercise.    Family History  Problem Relation Age of Onset  . Heart disease Father   . Heart attack Father 51       MI  . Coronary artery disease Other      Current Outpatient Medications:  .   amLODipine (NORVASC) 10 MG tablet, Take 10 mg by mouth daily. , Disp: , Rfl:  .  apixaban (ELIQUIS) 5 MG TABS tablet, Take 5 mg by mouth 2 (two) times daily., Disp: , Rfl:  .  atenolol (TENORMIN) 50 MG tablet, Take 50 mg by mouth daily. , Disp: , Rfl:  .  atorvastatin (LIPITOR) 80 MG tablet, Take 80 mg by mouth daily. , Disp: , Rfl:  .  ezetimibe (ZETIA) 10 MG tablet, Take 1 tablet (10 mg total) by mouth daily. (Patient taking differently: Take 10 mg by mouth every evening. ), Disp: 30 tablet, Rfl: 5 .  ferrous sulfate 325 (65 FE) MG tablet, Take 1 tablet by mouth daily. , Disp: , Rfl:  .  hydrochlorothiazide (HYDRODIURIL) 25 MG tablet, Take 25 mg by mouth daily. , Disp: , Rfl:  .  Potassium Chloride ER 20 MEQ TBCR, Take 20 mEq by mouth 2 (two) times daily. , Disp: , Rfl:   Physical exam:  Vitals:   03/10/17 1334  BP: (!) 142/77  Pulse: 76  Resp: 16  Temp: (!) 97.5 F (36.4 C)  TempSrc: Tympanic  Weight: 205 lb 12.8 oz (93.4 kg)   Physical Exam  Constitutional: He is oriented to person, place, and time and well-developed, well-nourished, and in no distress.  HENT:  Head: Normocephalic and atraumatic.  Eyes: EOM are normal. Pupils are equal, round, and reactive to light.  Neck: Normal range of motion.  Cardiovascular: Normal rate, regular rhythm and normal heart sounds.  Pulmonary/Chest: Effort normal and breath sounds normal.  Abdominal: Soft. Bowel sounds are normal.  Neurological: He is alert and oriented to person, place, and time.  Skin: Skin is warm and dry.     CMP Latest Ref Rng & Units 10/24/2016  Glucose 65 - 99 mg/dL 211(H)  BUN 6 - 20 mg/dL 22(H)  Creatinine 0.61 - 1.24 mg/dL 0.98  Sodium 135 - 145 mmol/L 137  Potassium 3.5 - 5.1 mmol/L 4.1  Chloride 101 - 111 mmol/L 102  CO2 22 - 32 mmol/L 27  Calcium 8.9 - 10.3 mg/dL 8.7(L)  Total Protein 6.5 - 8.1 g/dL 6.5  Total Bilirubin 0.3 - 1.2 mg/dL 1.2  Alkaline Phos 38 - 126 U/L 49  AST 15 - 41 U/L 21  ALT 17 - 63  U/L 16(L)   CBC Latest Ref Rng & Units 03/10/2017  WBC 3.8 - 10.6 K/uL 7.0  Hemoglobin 13.0 - 18.0 g/dL 14.5  Hematocrit 40.0 - 52.0 % 42.5  Platelets 150 - 440 K/uL 170     Assessment and plan- Patient is a 76 y.o. male with iron deficiency anemia and transient thrombocytopenia  1.  Iron deficiency anemia likely secondary to GI bleed versus Barrett's esophagus.  Currently patient is not anemic and his iron studies are normal.  He will continue to take oral iron and we will repeat his CBC ferritin and iron studies in 3 months in 6 months and I will see him back in 6 months.  2. Thrombocytopenia: It was transient and has resolved   Visit Diagnosis 1. Iron deficiency anemia, unspecified iron deficiency anemia type      Dr. Randa Evens, MD, MPH [facility] at [facility] Pager- [phone] 03/10/2017 4:08 PM

## 2017-05-11 ENCOUNTER — Telehealth: Payer: Self-pay | Admitting: Oncology

## 2017-05-11 NOTE — Telephone Encounter (Signed)
Lab/MD appts rschd per MD on PAL.  Appts rschd and conf with patient. MF

## 2017-06-09 ENCOUNTER — Inpatient Hospital Stay: Payer: Medicare Other | Attending: Oncology

## 2017-06-09 DIAGNOSIS — D696 Thrombocytopenia, unspecified: Secondary | ICD-10-CM | POA: Diagnosis not present

## 2017-06-09 DIAGNOSIS — D509 Iron deficiency anemia, unspecified: Secondary | ICD-10-CM | POA: Diagnosis present

## 2017-06-09 LAB — CBC WITH DIFFERENTIAL/PLATELET
BASOS ABS: 0 10*3/uL (ref 0–0.1)
Basophils Relative: 1 %
EOS ABS: 0.2 10*3/uL (ref 0–0.7)
Eosinophils Relative: 3 %
HCT: 40.6 % (ref 40.0–52.0)
Hemoglobin: 14.2 g/dL (ref 13.0–18.0)
LYMPHS ABS: 1.6 10*3/uL (ref 1.0–3.6)
LYMPHS PCT: 25 %
MCH: 30.8 pg (ref 26.0–34.0)
MCHC: 34.9 g/dL (ref 32.0–36.0)
MCV: 88.1 fL (ref 80.0–100.0)
MONO ABS: 0.5 10*3/uL (ref 0.2–1.0)
Monocytes Relative: 8 %
Neutro Abs: 4 10*3/uL (ref 1.4–6.5)
Neutrophils Relative %: 63 %
Platelets: 173 10*3/uL (ref 150–440)
RBC: 4.61 MIL/uL (ref 4.40–5.90)
RDW: 13.9 % (ref 11.5–14.5)
WBC: 6.4 10*3/uL (ref 3.8–10.6)

## 2017-06-13 ENCOUNTER — Telehealth: Payer: Self-pay | Admitting: *Deleted

## 2017-06-13 NOTE — Telephone Encounter (Signed)
Ok to hold off on iron studies as he is not anemic. He will be getting repeat lab work 3 months from now and he has iron studies ordered at that time . Thanks, Astrid Divine

## 2017-06-13 NOTE — Telephone Encounter (Signed)
Hospital lab called to report that the [place] did not get run Thursday. They states they did not receive specimen and our lab reports that it got sent to main lab for processing. Please advise of any new orders

## 2017-09-08 ENCOUNTER — Other Ambulatory Visit: Payer: Medicare Other

## 2017-09-08 ENCOUNTER — Ambulatory Visit: Payer: Medicare Other | Admitting: Oncology

## 2017-09-09 ENCOUNTER — Other Ambulatory Visit: Payer: Self-pay

## 2017-09-09 DIAGNOSIS — Z86010 Personal history of colonic polyps: Secondary | ICD-10-CM

## 2017-09-16 ENCOUNTER — Encounter: Payer: Self-pay | Admitting: Oncology

## 2017-09-16 ENCOUNTER — Inpatient Hospital Stay (HOSPITAL_BASED_OUTPATIENT_CLINIC_OR_DEPARTMENT_OTHER): Payer: Medicare Other | Admitting: Oncology

## 2017-09-16 ENCOUNTER — Inpatient Hospital Stay: Payer: Medicare Other | Attending: Oncology

## 2017-09-16 VITALS — BP 128/77 | HR 69 | Temp 97.1°F | Ht 76.0 in | Wt 204.6 lb

## 2017-09-16 DIAGNOSIS — Z8614 Personal history of Methicillin resistant Staphylococcus aureus infection: Secondary | ICD-10-CM

## 2017-09-16 DIAGNOSIS — I10 Essential (primary) hypertension: Secondary | ICD-10-CM

## 2017-09-16 DIAGNOSIS — D696 Thrombocytopenia, unspecified: Secondary | ICD-10-CM

## 2017-09-16 DIAGNOSIS — Z79899 Other long term (current) drug therapy: Secondary | ICD-10-CM

## 2017-09-16 DIAGNOSIS — I2510 Atherosclerotic heart disease of native coronary artery without angina pectoris: Secondary | ICD-10-CM | POA: Insufficient documentation

## 2017-09-16 DIAGNOSIS — D509 Iron deficiency anemia, unspecified: Secondary | ICD-10-CM

## 2017-09-16 DIAGNOSIS — K219 Gastro-esophageal reflux disease without esophagitis: Secondary | ICD-10-CM | POA: Insufficient documentation

## 2017-09-16 DIAGNOSIS — Z7901 Long term (current) use of anticoagulants: Secondary | ICD-10-CM

## 2017-09-16 DIAGNOSIS — E119 Type 2 diabetes mellitus without complications: Secondary | ICD-10-CM | POA: Diagnosis not present

## 2017-09-16 DIAGNOSIS — Z7984 Long term (current) use of oral hypoglycemic drugs: Secondary | ICD-10-CM

## 2017-09-16 DIAGNOSIS — I482 Chronic atrial fibrillation: Secondary | ICD-10-CM

## 2017-09-16 DIAGNOSIS — E785 Hyperlipidemia, unspecified: Secondary | ICD-10-CM | POA: Diagnosis not present

## 2017-09-16 LAB — CBC WITH DIFFERENTIAL/PLATELET
BASOS ABS: 0 10*3/uL (ref 0–0.1)
BASOS PCT: 1 %
Eosinophils Absolute: 0.1 10*3/uL (ref 0–0.7)
Eosinophils Relative: 2 %
HCT: 39.2 % — ABNORMAL LOW (ref 40.0–52.0)
Hemoglobin: 13.6 g/dL (ref 13.0–18.0)
Lymphocytes Relative: 27 %
Lymphs Abs: 1.6 10*3/uL (ref 1.0–3.6)
MCH: 30.5 pg (ref 26.0–34.0)
MCHC: 34.7 g/dL (ref 32.0–36.0)
MCV: 87.8 fL (ref 80.0–100.0)
MONO ABS: 0.6 10*3/uL (ref 0.2–1.0)
Monocytes Relative: 10 %
Neutro Abs: 3.8 10*3/uL (ref 1.4–6.5)
Neutrophils Relative %: 60 %
Platelets: 161 10*3/uL (ref 150–440)
RBC: 4.47 MIL/uL (ref 4.40–5.90)
RDW: 14.3 % (ref 11.5–14.5)
WBC: 6.2 10*3/uL (ref 3.8–10.6)

## 2017-09-16 LAB — IRON AND TIBC
Iron: 55 ug/dL (ref 45–182)
SATURATION RATIOS: 17 % — AB (ref 17.9–39.5)
TIBC: 318 ug/dL (ref 250–450)
UIBC: 263 ug/dL

## 2017-09-16 NOTE — Progress Notes (Signed)
No new changes noted today 

## 2017-09-19 NOTE — Progress Notes (Signed)
Hematology/Oncology Consult note [facility]  [phone] [phone]  Patient Care Team: Theotis Burrow, MD as PCP - General (Family Medicine)   Name of the patient: Walter Mosley  [id number]  [birth year]   Date of visit: 09/19/17  Diagnosis- iron deficiency anemia. Thrombocytopenia transient resolved  Chief complaint/ Reason for visit- routine f/u of anemia and thrombocytopenia  Heme/Onc history: patient is a 77 year old male was then referred to [place] for thrombocytopenia. Patient has had multiple CBCs with [place] in the past including most recent one from February 2018 which showed a normal platelet count. Recent CBC that he had with his primary care doctor in May 2018 showed white count of 8.2, H&H of 12.8/37.2 and a platelet count of 134. Patient is currently on Eliquis for hisafibthere was concern for mild anemia and thrombocytopenia in the setting of Eliquis use and hence has been referred to [place] for the same. His CMP was within normal limits TSH was within normal limits and HIV hepatitis A, B, and C testing was also done was normal. Iron studies showed iron saturation of 13% and TIBC that was normal at 366.   EGD and colonoscopy in jule 2018 was unremarkable  Further workup from 08/31/2016 was as follows: CBC showed white count of 4.8, H&H of 12.9/37.4 and a platelet count of 188. Peripheral smear review showed mild normocytic anemia without any morphological abnormalities of RBCs and WBCs. Mild thrombocytopenia with normal platelet morphology. Ferritin levels were low at 19. B12 and folate was within normal limits. Multiple myeloma panel showed no monoclonal protein. Haptoglobin was normal at 72    Interval history- Patient is currently taking oral iron once daily. No side effects with that. dneies any bleeding in stool or urine. Denies any easy bruising  ECOG PS- 0 Pain scale- 0   Review of systems- Review of Systems    Constitutional: Negative for chills, fever, malaise/fatigue and weight loss.  HENT: Negative for congestion, ear discharge and nosebleeds.   Eyes: Negative for blurred vision.  Respiratory: Negative for cough, hemoptysis, sputum production, shortness of breath and wheezing.   Cardiovascular: Negative for chest pain, palpitations, orthopnea and claudication.  Gastrointestinal: Negative for abdominal pain, blood in stool, constipation, diarrhea, heartburn, melena, nausea and vomiting.  Genitourinary: Negative for dysuria, flank pain, frequency, hematuria and urgency.  Musculoskeletal: Negative for back pain, joint pain and myalgias.  Skin: Negative for rash.  Neurological: Negative for dizziness, tingling, focal weakness, seizures, weakness and headaches.  Endo/Heme/Allergies: Does not bruise/bleed easily.  Psychiatric/Behavioral: Negative for depression and suicidal ideas. The patient does not have insomnia.        Allergies  Allergen Reactions  . Xarelto [Rivaroxaban]     Other reaction(s): Other (See Comments) Bleeding gums Bleeding gums     Past Medical History:  Diagnosis Date  . Atrial flutter ([place])    s/p ablation  . Cancer (HCC)    squamous cell carcimnoma right leg  . Chronic atrial fibrillation (HCC)    a. CHA2DS2VASc = 5-->eliquis 5 bid. (h/o epistaxis and gingival bleeding on xarelto).  . Coronary artery disease    a. 1993 s/p CABG;  b. 08/2016 Lexi MV: no ischemia/infarct->low risk. EF 55-65%.  . Diabetes mellitus without complication ([place])   . Dyspnea on exertion    a. 08/2016 Echo: EF 55-60%, no rwma, mild MR, mildly dil LA/RA, mild MR.  [name] GERD (gastroesophageal reflux disease)   . Hyperlipidemia    Mixed  . Hypertension  Unspecified  . 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 Osteoarthritis, knee   . PAT (paroxysmal atrial tachycardia) ([place])   . PSVT (paroxysmal supraventricular tachycardia) ([place])      Past Surgical History:   Procedure Laterality Date  . CARDIAC CATHETERIZATION  1993  . CARDIAC ELECTROPHYSIOLOGY STUDY AND ABLATION    . CARDIAC SURGERY     by pass  . COLONOSCOPY WITH PROPOFOL N/A 10/14/2016   Procedure: COLONOSCOPY WITH PROPOFOL;  Surgeon: Manya Silvas, MD;  Location: [facility] ENDOSCOPY;  Service: Endoscopy;  Laterality: N/A;  . CORONARY ARTERY BYPASS GRAFT  1993   CABG x 5 @ [place]  . ESOPHAGOGASTRODUODENOSCOPY (EGD) WITH PROPOFOL N/A 10/14/2016   Procedure: ESOPHAGOGASTRODUODENOSCOPY (EGD) WITH PROPOFOL;  Surgeon: Manya Silvas, MD;  Location: [facility] ENDOSCOPY;  Service: Endoscopy;  Laterality: N/A;  . HEMORRHOID SURGERY    . MOHS SURGERY      Social History   Socioeconomic History  . Marital status: Married    Spouse name: Not on file  . Number of children: Not on file  . Years of education: Not on file  . Highest education level: Not on file  Occupational History  . Occupation: Retired  Scientific laboratory technician  . Financial resource strain: Not on file  . Food insecurity:    Worry: Not on file    Inability: Not on file  . Transportation needs:    Medical: Not on file    Non-medical: Not on file  Tobacco Use  . Smoking status: Never Smoker  . Smokeless tobacco: Never Used  Substance and Sexual Activity  . Alcohol use: No  . Drug use: No  . Sexual activity: Not on file  Lifestyle  . Physical activity:    Days per week: Not on file    Minutes per session: Not on file  . Stress: Not on file  Relationships  . Social connections:    Talks on phone: Not on file    Gets together: Not on file    Attends religious service: Not on file    Active member of club or organization: Not on file    Attends meetings of clubs or organizations: Not on file    Relationship status: Not on file  . Intimate partner violence:    Fear of current or ex partner: Not on file    Emotionally abused: Not on file    Physically abused: Not on file    Forced sexual activity: Not on file  Other Topics Concern   . Not on file  Social History Narrative   Gets regular exercise.    Family History  Problem Relation Age of Onset  . Heart disease Father   . Heart attack Father 29       MI  . Coronary artery disease Other      Current Outpatient Medications:  .  amLODipine (NORVASC) 10 MG tablet, Take 10 mg by mouth daily. , Disp: , Rfl:  .  apixaban (ELIQUIS) 5 MG TABS tablet, Take 5 mg by mouth 2 (two) times daily., Disp: , Rfl:  .  atenolol (TENORMIN) 50 MG tablet, Take 50 mg by mouth daily. , Disp: , Rfl:  .  atorvastatin (LIPITOR) 80 MG tablet, Take 80 mg by mouth daily. , Disp: , Rfl:  .  ezetimibe (ZETIA) 10 MG tablet, Take 1 tablet (10 mg total) by mouth daily. (Patient taking differently: Take 10 mg by mouth every evening. ), Disp: 30 tablet, Rfl: 5 .  ferrous sulfate 325 (65 FE) MG tablet, Take 1 tablet by mouth daily. , Disp: , Rfl:  .  hydrochlorothiazide (HYDRODIURIL) 25 MG tablet, Take 25 mg by mouth daily. , Disp: , Rfl:  .  metFORMIN (GLUCOPHAGE) 500 MG tablet, Take 500 mg by mouth daily., Disp: , Rfl:  .  Potassium Chloride ER 20 MEQ TBCR, Take 20 mEq by mouth 2 (two) times daily. , Disp: , Rfl:   Physical exam:  Vitals:   09/16/17 1535  BP: 128/77  Pulse: 69  Temp: (!) 97.1 F (36.2 C)  SpO2: 96%  Weight: 204 lb 9.6 oz (92.8 kg)  Height: 6' 4"  (1.93 m)   Physical Exam  Constitutional: He is oriented to person, place, and time. He appears well-developed and well-nourished.  HENT:  Head: Normocephalic and atraumatic.  Eyes: Pupils are equal, round, and reactive to light. EOM are normal.  Neck: Normal range of motion.  Cardiovascular: Normal rate, regular rhythm and normal heart sounds.  Pulmonary/Chest: Effort normal and breath sounds normal.  Abdominal: Soft. Bowel sounds are normal.  Neurological: He is alert and oriented to person, place, and time.  Skin: Skin is warm and dry.     CMP Latest Ref Rng & Units 10/24/2016  Glucose 65 - 99 mg/dL 211(H)  BUN 6 - 20  mg/dL 22(H)  Creatinine 0.61 - 1.24 mg/dL 0.98  Sodium 135 - 145 mmol/L 137  Potassium 3.5 - 5.1 mmol/L 4.1  Chloride 101 - 111 mmol/L 102  CO2 22 - 32 mmol/L 27  Calcium 8.9 - 10.3 mg/dL 8.7(L)  Total Protein 6.5 - 8.1 g/dL 6.5  Total Bilirubin 0.3 - 1.2 mg/dL 1.2  Alkaline Phos 38 - 126 U/L 49  AST 15 - 41 U/L 21  ALT 17 - 63 U/L 16(L)   CBC Latest Ref Rng & Units 09/16/2017  WBC 3.8 - 10.6 K/uL 6.2  Hemoglobin 13.0 - 18.0 g/dL 13.6  Hematocrit 40.0 - 52.0 % 39.2(L)  Platelets 150 - 440 K/uL 161     Assessment and plan- Patient is a 77 y.o. male with iron deficiency anemia and trasient thrombocytopenia  1. Iron deficiency anemia: He is not anemic today. Iron studies are normal. He can stop his oral iron at this time. Repeat cbc ferritina nd iron studies in 3 and 6 months and I will see him back in 6 months. If his iron levels drop after 3 months- we will ask him to restart his oral iron  2. Thrombocytopenia- resolved   Visit Diagnosis 1. Iron deficiency anemia, unspecified iron deficiency anemia type      Dr. Randa Evens, MD, MPH [facility] [phone] 09/19/2017 9:03 AM

## 2017-09-28 ENCOUNTER — Telehealth: Payer: Self-pay | Admitting: Cardiovascular Disease

## 2017-09-28 ENCOUNTER — Other Ambulatory Visit: Payer: Self-pay

## 2017-09-28 ENCOUNTER — Encounter: Payer: Self-pay | Admitting: *Deleted

## 2017-09-28 NOTE — Anesthesia Preprocedure Evaluation (Addendum)
Anesthesia Evaluation  Patient identified by MRN, date of birth, ID band Patient awake    Reviewed: Allergy & Precautions, NPO status   Airway Mallampati: II  TM Distance: >3 FB     Dental   Pulmonary    breath sounds clear to auscultation       Cardiovascular hypertension, + CABG (1993)   Rhythm:Irregular Rate:Normal  Atrial flutter/PSVT s/p ablation Atrial fibrillation - takes eliquis HLD  08/2016 Lexi MV: no ischemia/infarct->low risk. EF 55-65%.   Neuro/Psych    GI/Hepatic GERD  ,Hx melena   Endo/Other  diabetes, Type 2  Renal/GU      Musculoskeletal  (+) Arthritis ,   Abdominal   Peds  Hematology   Anesthesia Other Findings   Reproductive/Obstetrics                           Anesthesia Physical Anesthesia Plan  ASA: III  Anesthesia Plan: General   Post-op Pain Management:    Induction:   PONV Risk Score and Plan:   Airway Management Planned: Natural Airway and Nasal Cannula  Additional Equipment:   Intra-op Plan:   Post-operative Plan:   Informed Consent: I have reviewed the patients History and Physical, chart, labs and discussed the procedure including the risks, benefits and alternatives for the proposed anesthesia with the patient or authorized representative who has indicated his/her understanding and acceptance.     Plan Discussed with: CRNA  Anesthesia Plan Comments:         Anesthesia Quick Evaluation

## 2017-09-28 NOTE — Telephone Encounter (Signed)
Patient states he will be having a colonoscopy with Dr. Allen Norris on Monday 10/03/17 States he was told to ask when he should hold Eliquis and for how long - told patient to also contact Dr. Lynnell Jude office to send a request Please call to discuss

## 2017-09-29 ENCOUNTER — Telehealth: Payer: Self-pay | Admitting: Cardiovascular Disease

## 2017-09-29 ENCOUNTER — Other Ambulatory Visit: Payer: Self-pay

## 2017-09-29 NOTE — Telephone Encounter (Signed)
S/w patient. He said he spoke with the pre-op nurse yesterday and they were supposed to be sending [place] a clearance. We have not received anything yet. Let the patient know we will address this soon.  Patient's procedure is Monday. Left message with Dr Dorothey Baseman to send [place] clearance to fax [phone].  Will go ahead and route to Dr Rockey Situ to advise on holding Eliquis because procedure is Monday. Patient denies any new cardiac symptoms. No chest pain or shortness of breath.

## 2017-09-29 NOTE — Telephone Encounter (Signed)
   [facility] Pre-operative Risk Assessment    Request for surgical clearance:  1. What type of surgery is being performed? colonoscopy  2. When is this surgery scheduled? 10/03/2017  3. What type of clearance is required (medical clearance vs. Pharmacy clearance to hold med vs. Both)? Not listed  4. Are there any medications that need to be held prior to surgery and how long? Eliquis  5. Practice name and name of physician performing surgery? [facility], Dr. [name]  6. What is your office phone number [phone]   7.   What is your office fax number [phone]  8.   Anesthesia type (None, local, MAC, general) ? Not listed   Walter Mosley 09/29/2017, 2:32 PM  _________________________________________________________________   (provider comments below)

## 2017-09-29 NOTE — Telephone Encounter (Signed)
Please address clearance and Eliquis. Thanks!

## 2017-09-30 ENCOUNTER — Other Ambulatory Visit: Payer: Self-pay

## 2017-09-30 NOTE — Telephone Encounter (Signed)
Copy of this encounter faxed to Dr. Dorothey Baseman office at [phone]. Confirmation received.

## 2017-09-30 NOTE — Discharge Instructions (Signed)
General Anesthesia, Adult, Care After °These instructions provide you with information about caring for yourself after your procedure. Your health care provider may also give you more specific instructions. Your treatment has been planned according to current medical practices, but problems sometimes occur. Call your health care provider if you have any problems or questions after your procedure. °What can I expect after the procedure? °After the procedure, it is common to have: °· Vomiting. °· A sore throat. °· Mental slowness. ° °It is common to feel: °· Nauseous. °· Cold or shivery. °· Sleepy. °· Tired. °· Sore or achy, even in parts of your body where you did not have surgery. ° °Follow these instructions at home: °For at least 24 hours after the procedure: °· Do not: °? Participate in activities where you could fall or become injured. °? Drive. °? Use heavy machinery. °? Drink alcohol. °? Take sleeping pills or medicines that cause drowsiness. °? Make important decisions or sign legal documents. °? Take care of children on your own. °· Rest. °Eating and drinking °· If you vomit, drink water, juice, or soup when you can drink without vomiting. °· Drink enough fluid to keep your urine clear or pale yellow. °· Make sure you have little or no nausea before eating solid foods. °· Follow the diet recommended by your health care provider. °General instructions °· Have a responsible adult stay with you until you are awake and alert. °· Return to your normal activities as told by your health care provider. Ask your health care provider what activities are safe for you. °· Take over-the-counter and prescription medicines only as told by your health care provider. °· If you smoke, do not smoke without supervision. °· Keep all follow-up visits as told by your health care provider. This is important. °Contact a health care provider if: °· You continue to have nausea or vomiting at home, and medicines are not helpful. °· You  cannot drink fluids or start eating again. °· You cannot urinate after 8-12 hours. °· You develop a skin rash. °· You have fever. °· You have increasing redness at the site of your procedure. °Get help right away if: °· You have difficulty breathing. °· You have chest pain. °· You have unexpected bleeding. °· You feel that you are having a life-threatening or urgent problem. °This information is not intended to replace advice given to you by your health care provider. Make sure you discuss any questions you have with your health care provider. °Document Released: 07/05/2000 Document Revised: 09/01/2015 Document Reviewed: 03/13/2015 °Elsevier Interactive Patient Education © 2018 [employer] Inc[employer] ° °

## 2017-09-30 NOTE — Telephone Encounter (Signed)
I have spoken with the patient and he is aware of Dr. Donivan Scull recommendations regarding eliquis. He verbalizes understanding.  Will fax a copy of this encounter to Dr. Dorothey Baseman office.

## 2017-09-30 NOTE — Telephone Encounter (Signed)
Acceptable risk to hold the eliquis 2 days prior to the procedure Would restart the anticoagulation ASAP at the direction of Dr. Allen Norris following the procedure No further testing needed

## 2017-10-03 ENCOUNTER — Ambulatory Visit: Payer: Medicare Other | Admitting: Anesthesiology

## 2017-10-03 ENCOUNTER — Ambulatory Visit
Admission: RE | Admit: 2017-10-03 | Discharge: 2017-10-03 | Disposition: A | Discharge status: Home / Self Care | Payer: Medicare Other | Source: Ambulatory Visit | Attending: Gastroenterology | Admitting: Gastroenterology

## 2017-10-03 ENCOUNTER — Encounter
Admission: RE | Disposition: A | Discharge status: Home / Self Care | Payer: Self-pay | Source: Ambulatory Visit | Attending: Gastroenterology

## 2017-10-03 DIAGNOSIS — K635 Polyp of colon: Secondary | ICD-10-CM | POA: Diagnosis not present

## 2017-10-03 DIAGNOSIS — I482 Chronic atrial fibrillation: Secondary | ICD-10-CM | POA: Insufficient documentation

## 2017-10-03 DIAGNOSIS — Z951 Presence of aortocoronary bypass graft: Secondary | ICD-10-CM | POA: Diagnosis not present

## 2017-10-03 DIAGNOSIS — Z7984 Long term (current) use of oral hypoglycemic drugs: Secondary | ICD-10-CM | POA: Diagnosis not present

## 2017-10-03 DIAGNOSIS — D124 Benign neoplasm of descending colon: Secondary | ICD-10-CM | POA: Insufficient documentation

## 2017-10-03 DIAGNOSIS — E119 Type 2 diabetes mellitus without complications: Secondary | ICD-10-CM | POA: Diagnosis not present

## 2017-10-03 DIAGNOSIS — I2510 Atherosclerotic heart disease of native coronary artery without angina pectoris: Secondary | ICD-10-CM | POA: Insufficient documentation

## 2017-10-03 DIAGNOSIS — E782 Mixed hyperlipidemia: Secondary | ICD-10-CM | POA: Insufficient documentation

## 2017-10-03 DIAGNOSIS — Z860100 Personal history of colon polyps, unspecified: Secondary | ICD-10-CM

## 2017-10-03 DIAGNOSIS — K641 Second degree hemorrhoids: Secondary | ICD-10-CM | POA: Insufficient documentation

## 2017-10-03 DIAGNOSIS — Z85828 Personal history of other malignant neoplasm of skin: Secondary | ICD-10-CM | POA: Diagnosis not present

## 2017-10-03 DIAGNOSIS — I10 Essential (primary) hypertension: Secondary | ICD-10-CM | POA: Insufficient documentation

## 2017-10-03 DIAGNOSIS — D125 Benign neoplasm of sigmoid colon: Secondary | ICD-10-CM | POA: Diagnosis not present

## 2017-10-03 DIAGNOSIS — Z124 Encounter for screening for malignant neoplasm of cervix: Secondary | ICD-10-CM

## 2017-10-03 DIAGNOSIS — Z7901 Long term (current) use of anticoagulants: Secondary | ICD-10-CM | POA: Diagnosis not present

## 2017-10-03 DIAGNOSIS — Z09 Encounter for follow-up examination after completed treatment for conditions other than malignant neoplasm: Secondary | ICD-10-CM | POA: Diagnosis present

## 2017-10-03 DIAGNOSIS — Z79899 Other long term (current) drug therapy: Secondary | ICD-10-CM | POA: Diagnosis not present

## 2017-10-03 HISTORY — PX: POLYPECTOMY: SHX5525

## 2017-10-03 HISTORY — PX: COLONOSCOPY WITH PROPOFOL: SHX5780

## 2017-10-03 LAB — GLUCOSE, CAPILLARY
GLUCOSE-CAPILLARY: 114 mg/dL — AB (ref 65–99)
Glucose-Capillary: 99 mg/dL (ref 65–99)

## 2017-10-03 SURGERY — COLONOSCOPY WITH PROPOFOL
Anesthesia: General | Site: Rectum | Wound class: Contaminated

## 2017-10-03 MED ORDER — LIDOCAINE HCL (CARDIAC) PF 100 MG/5ML IV SOSY
PREFILLED_SYRINGE | INTRAVENOUS | Status: DC | PRN
  Administered 2017-10-03: 20 mg via INTRAVENOUS

## 2017-10-03 MED ORDER — STERILE WATER FOR IRRIGATION IR SOLN
Status: DC | PRN
  Administered 2017-10-03: 5 mL

## 2017-10-03 MED ORDER — SODIUM CHLORIDE 0.9 % IV SOLN: INTRAVENOUS | Status: DC

## 2017-10-03 MED ORDER — ONDANSETRON HCL 4 MG/2ML IJ SOLN: 4.0000 mg | Freq: Once | INTRAMUSCULAR | Status: DC | PRN

## 2017-10-03 MED ORDER — PROPOFOL 10 MG/ML IV BOLUS
INTRAVENOUS | Status: DC | PRN
  Administered 2017-10-03: 50 mg via INTRAVENOUS
  Administered 2017-10-03: 60 mg via INTRAVENOUS
  Administered 2017-10-03 (×4): 30 mg via INTRAVENOUS
  Administered 2017-10-03: 60 mg via INTRAVENOUS

## 2017-10-03 MED ORDER — LACTATED RINGERS IV SOLN
10.0000 mL/h | INTRAVENOUS | Status: DC
  Administered 2017-10-03: 10 mL/h via INTRAVENOUS
  Administered 2017-10-03: 08:00:00 via INTRAVENOUS

## 2017-10-03 SURGICAL SUPPLY — 24 items
CANISTER SUCT 1200ML W/VALVE (MISCELLANEOUS) ×4 IMPLANT
CLIP HMST 235XBRD CATH ROT (MISCELLANEOUS) IMPLANT
CLIP RESOLUTION 360 11X235 (MISCELLANEOUS)
ELECT REM PT RETURN 9FT ADLT (ELECTROSURGICAL)
ELECTRODE REM PT RTRN 9FT ADLT (ELECTROSURGICAL) IMPLANT
FCP ESCP3.2XJMB 240X2.8X (MISCELLANEOUS)
FORCEPS BIOP RAD 4 LRG CAP 4 (CUTTING FORCEPS) ×4 IMPLANT
FORCEPS BIOP RJ4 240 W/NDL (MISCELLANEOUS)
FORCEPS ESCP3.2XJMB 240X2.8X (MISCELLANEOUS) IMPLANT
GOWN CVR UNV OPN BCK APRN NK (MISCELLANEOUS) ×4 IMPLANT
GOWN ISOL THUMB LOOP REG UNIV (MISCELLANEOUS) ×4
INJECTOR VARIJECT VIN23 (MISCELLANEOUS) IMPLANT
KIT DEFENDO VALVE AND CONN (KITS) IMPLANT
KIT ENDO PROCEDURE OLY (KITS) ×4 IMPLANT
MARKER SPOT ENDO TATTOO 5ML (MISCELLANEOUS) IMPLANT
PROBE APC STR FIRE (PROBE) IMPLANT
RETRIEVER NET ROTH 2.5X230 LF (MISCELLANEOUS) IMPLANT
SNARE SHORT THROW 13M SML OVAL (MISCELLANEOUS) IMPLANT
SNARE SHORT THROW 30M LRG OVAL (MISCELLANEOUS) IMPLANT
SNARE SNG USE RND 15MM (INSTRUMENTS) IMPLANT
SPOT EX ENDOSCOPIC TATTOO (MISCELLANEOUS)
TRAP ETRAP POLY (MISCELLANEOUS) IMPLANT
VARIJECT INJECTOR VIN23 (MISCELLANEOUS)
WATER STERILE IRR 250ML POUR (IV SOLUTION) ×4 IMPLANT

## 2017-10-03 NOTE — H&P (Signed)
Walter Lame, MD [address] [place], [place] [address] Phone:[phone] Fax : [phone]  Primary Care Physician:  Theotis Burrow, MD Primary Gastroenterologist:  Dr. Allen Norris  Pre-Procedure History & Physical: HPI:  Walter Mosley. is a 77 y.o. male is here for an colonoscopy.   Past Medical History:  Diagnosis Date  . Atrial flutter ([place])    s/p ablation  . Cancer (HCC)    squamous cell carcimnoma right leg  . Chronic atrial fibrillation (HCC)    a. CHA2DS2VASc = 5-->eliquis 5 bid. (h/o epistaxis and gingival bleeding on xarelto).  . Coronary artery disease    a. 1993 s/p CABG;  b. 08/2016 Lexi MV: no ischemia/infarct->low risk. EF 55-65%.  . Diabetes mellitus without complication ([place])   . Dyspnea on exertion    a. 08/2016 Echo: EF 55-60%, no rwma, mild MR, mildly dil LA/RA, mild MR.  [name] GERD (gastroesophageal reflux disease)   . Hyperlipidemia    Mixed  . Hypertension    Unspecified  . 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 Osteoarthritis, knee   . PAT (paroxysmal atrial tachycardia) ([place])   . PSVT (paroxysmal supraventricular tachycardia) ([place])     Past Surgical History:  Procedure Laterality Date  . CARDIAC CATHETERIZATION  1993  . CARDIAC ELECTROPHYSIOLOGY STUDY AND ABLATION    . CARDIAC SURGERY     by pass  . COLONOSCOPY WITH PROPOFOL N/A 10/14/2016   Procedure: COLONOSCOPY WITH PROPOFOL;  Surgeon: Manya Silvas, MD;  Location[facility] ENDOSCOPY;  Service: Endoscopy;  Laterality: N/A;  . CORONARY ARTERY BYPASS GRAFT  1993   CABG x 5 @ [place]  . ESOPHAGOGASTRODUODENOSCOPY (EGD) WITH PROPOFOL N/A 10/14/2016   Procedure: ESOPHAGOGASTRODUODENOSCOPY (EGD) WITH PROPOFOL;  Surgeon: Manya Silvas, MD;  Location: [facility] ENDOSCOPY;  Service: Endoscopy;  Laterality: N/A;  . HEMORRHOID SURGERY    . MOHS SURGERY      Prior to Admission medications   Medication Sig Start Date End Date Taking? Authorizing Provider    amLODipine (NORVASC) 10 MG tablet Take 10 mg by mouth daily.    Yes [provider]  apixaban (ELIQUIS) 5 MG TABS tablet Take 5 mg by mouth 2 (two) times daily.   Yes [provider]  atenolol (TENORMIN) 50 MG tablet Take 50 mg by mouth daily.    Yes [provider]  atorvastatin (LIPITOR) 80 MG tablet Take 80 mg by mouth daily.    Yes [provider]  ezetimibe (ZETIA) 10 MG tablet Take 1 tablet (10 mg total) by mouth daily. Patient taking differently: Take 10 mg by mouth every evening.  01/17/15  Yes Wellington Hampshire, MD  hydrochlorothiazide (HYDRODIURIL) 25 MG tablet Take 25 mg by mouth daily.    Yes [provider]  metFORMIN (GLUCOPHAGE) 500 MG tablet Take 500 mg by mouth daily.   Yes [provider]  Potassium Chloride ER 20 MEQ TBCR Take 20 mEq by mouth 2 (two) times daily.    Yes [provider]  ferrous sulfate 325 (65 FE) MG tablet Take 1 tablet by mouth daily.     [provider]    Allergies as of 09/09/2017 - Review Complete 03/10/2017  Allergen Reaction Noted  . Xarelto [rivaroxaban]  02/26/2013    Family History  Problem Relation Age of Onset  . Heart disease Father   . Heart attack Father 13       MI  . Coronary artery disease Other  Social History   Socioeconomic History  . Marital status: Married    Spouse name: Not on file  . Number of children: Not on file  . Years of education: Not on file  . Highest education level: Not on file  Occupational History  . Occupation: Retired  Scientific laboratory technician  . Financial resource strain: Not on file  . Food insecurity:    Worry: Not on file    Inability: Not on file  . Transportation needs:    Medical: Not on file    Non-medical: Not on file  Tobacco Use  . Smoking status: Never Smoker  . Smokeless tobacco: Never Used  Substance and Sexual Activity  . Alcohol use: No    Comment: may have 1 beer/month  . Drug use: No  . Sexual activity: Not on  file  Lifestyle  . Physical activity:    Days per week: Not on file    Minutes per session: Not on file  . Stress: Not on file  Relationships  . Social connections:    Talks on phone: Not on file    Gets together: Not on file    Attends religious service: Not on file    Active member of club or organization: Not on file    Attends meetings of clubs or organizations: Not on file    Relationship status: Not on file  . Intimate partner violence:    Fear of current or ex partner: Not on file    Emotionally abused: Not on file    Physically abused: Not on file    Forced sexual activity: Not on file  Other Topics Concern  . Not on file  Social History Narrative   Gets regular exercise.    Review of Systems: See HPI, otherwise negative ROS  Physical Exam: BP (!) 145/69   Pulse 88   Temp 98.1 F (36.7 C) (Temporal)   Resp 16   Ht 6\' 4"  (1.93 m)   Wt 194 lb (88 kg)   SpO2 98%   BMI 23.61 kg/m  General:   Alert,  pleasant and cooperative in NAD Head:  Normocephalic and atraumatic. Neck:  Supple; no masses or thyromegaly. Lungs:  Clear throughout to auscultation.    Heart:  Regular rate and rhythm. Abdomen:  Soft, nontender and nondistended. Normal bowel sounds, without guarding, and without rebound.   Neurologic:  Alert and  oriented x4;  grossly normal neurologically.  Impression/Plan: [name]. is here for an colonoscopy to be performed for history of colon polyps  Risks, benefits, limitations, and alternatives regarding  colonoscopy have been reviewed with the patient.  Questions have been answered.  All parties agreeable.   Walter Lame, MD  10/03/2017, 7:31 AM

## 2017-10-03 NOTE — Anesthesia Procedure Notes (Signed)
Procedure Name: MAC Date/Time: 10/03/2017 7:59 AM Performed by: Lind Guest, CRNA Pre-anesthesia Checklist: Patient identified, Emergency Drugs available, Suction available, Patient being monitored and Timeout performed Patient Re-evaluated:Patient Re-evaluated prior to induction Oxygen Delivery Method: Nasal cannula

## 2017-10-03 NOTE — Anesthesia Postprocedure Evaluation (Signed)
Anesthesia Post Note  Patient: Walter Mosley.  Procedure(s) Performed: COLONOSCOPY WITH PROPOFOL (N/A Rectum) POLYPECTOMY (N/A )  Patient location during evaluation: PACU Anesthesia Type: General Level of consciousness: awake Pain management: pain level controlled Vital Signs Assessment: post-procedure vital signs reviewed and stable Respiratory status: respiratory function stable Cardiovascular status: stable Postop Assessment: no signs of nausea or vomiting Anesthetic complications: no    Veda Canning

## 2017-10-03 NOTE — Op Note (Signed)
[facility] Patient Name: Walter Mosley Procedure Date: 10/03/2017 7:52 AM MRN: [id number] Account #: [id number] Date of Birth: 07-04-1940 Admit Type: Outpatient Age: 77 Room: [facility] OR ROOM 01 Gender: Male Note Status: Finalized Procedure:            Colonoscopy Indications:          High risk colon cancer surveillance: Personal history                        of colonic polyps Providers:            [name] MD, MD Medicines:            Propofol per Anesthesia Complications:        No immediate complications. Procedure:            Pre-Anesthesia Assessment:                       - Prior to the procedure, a History and Physical was                        performed, and patient medications and allergies were                        reviewed. The patient's tolerance of previous                        anesthesia was also reviewed. The risks and benefits of                        the procedure and the sedation options and risks were                        discussed with the patient. All questions were                        answered, and informed consent was obtained. Prior                        Anticoagulants: The patient has taken no previous                        anticoagulant or antiplatelet agents. ASA Grade                        Assessment: II - A patient with mild systemic disease.                        After reviewing the risks and benefits, the patient was                        deemed in satisfactory condition to undergo the                        procedure.                       After obtaining informed consent, the colonoscope was                        passed under direct vision. Throughout the  procedure,                        the patient's blood pressure, pulse, and oxygen                        saturations were monitored continuously. The                        Colonoscope was introduced through the anus and   advanced to the the cecum, identified by appendiceal                        orifice and ileocecal valve. The colonoscopy was                        performed without difficulty. The patient tolerated the                        procedure well. The quality of the bowel preparation                        was excellent. Findings:      The perianal and digital rectal examinations were normal.      Three sessile polyps were found in the descending colon. The polyps were       2 to 3 mm in size. These polyps were removed with a cold biopsy forceps.       Resection and retrieval were complete.      Two sessile polyps were found in the sigmoid colon. The polyps were 2 to       3 mm in size. These polyps were removed with a cold biopsy forceps.       Resection and retrieval were complete.      Non-bleeding internal hemorrhoids were found during retroflexion. The       hemorrhoids were Grade II (internal hemorrhoids that prolapse but reduce       spontaneously).      Previousy placed clip seen in the rectum. Impression:           - Three 2 to 3 mm polyps in the descending colon,                        removed with a cold biopsy forceps. Resected and                        retrieved.                       - Two 2 to 3 mm polyps in the sigmoid colon, removed                        with a cold biopsy forceps. Resected and retrieved.                       - Non-bleeding internal hemorrhoids.                       - Previousy placed clip seen in the rectum. Recommendation:       - Discharge patient to home.                       -  Resume previous diet.                       - Continue present medications. Procedure Code(s):    --- Professional ---                       [phone], Colonoscopy, flexible; with biopsy, single or                        multiple Diagnosis Code(s):    --- Professional ---                       Z86.010, Personal history of colonic polyps                       D12.4, Benign  neoplasm of descending colon                       D12.5, Benign neoplasm of sigmoid colon CPT copyright 2017 American Medical Association. All rights reserved. The codes documented in this report are preliminary and upon coder review may  be revised to meet current compliance requirements. [name] MD, MD 10/03/2017 8:21:12 AM This report has been signed electronically. Number of Addenda: 0 Note Initiated On: 10/03/2017 7:52 AM Scope Withdrawal Time: 0 hours 7 minutes 21 seconds  Total Procedure Duration: 0 hours 11 minutes 49 seconds       [facility]

## 2017-10-03 NOTE — Transfer of Care (Signed)
Immediate Anesthesia Transfer of Care Note  Patient: Walter Mosley.  Procedure(s) Performed: COLONOSCOPY WITH PROPOFOL (N/A Rectum) POLYPECTOMY (N/A )  Patient Location: PACU  Anesthesia Type: General  Level of Consciousness: awake, alert  and patient cooperative  Airway and Oxygen Therapy: Patient Spontanous Breathing and Patient connected to supplemental oxygen  Post-op Assessment: Post-op Vital signs reviewed, Patient's Cardiovascular Status Stable, Respiratory Function Stable, Patent Airway and No signs of Nausea or vomiting  Post-op Vital Signs: Reviewed and stable  Complications: No apparent anesthesia complications

## 2017-10-04 ENCOUNTER — Encounter: Payer: Self-pay | Admitting: Gastroenterology

## 2017-10-26 NOTE — Progress Notes (Signed)
Cardiology Office Note Date:  10/27/2017  Patient ID:  Walter Mosley., DOB 1940/10/10, MRN [id number] PCP:  Theotis Burrow, MD  Cardiologist:  Dr. Fletcher Anon, MD    Chief Complaint: Follow up  History of Present Illness: Walter Mosley. is a 77 y.o. male with history of CAD s/p CABG in 1993, atrial flutter noted in 2010 s/p ablation, chronic Afib on Eliquis, symptomatic bradycardia in 2014 improving with cessation of diltiazem, GI bleed in 10/2016 following polypectomy after 8 polyps were removed subsequently being resumed on Eliquis 3 days later without need of transfusion, iron deficiency anemia, DM2, HTN, HLD, MRSA, and GERD who presents for follow-up of his CAD and A. fib.  Patient underwent 5-vessel CABG in 1993 at [facility]. He has not had any ischemic events since. He was noted to have exertional dyspnea in mid 2018 and underwent echo in 08/2016 that showed normal LV systolic function with an EF of 55-60%, no RWMA, mild mitral and tricuspid regurgitation, no evidence of pulmonary hypertension. Nuclear stress test in 08/2016 showed no evidence of ischemia or perfusion defects. He was most recently seen in 11/2016 and was doing well at that time. He has subsequently undergone repeat colonoscopy in late 09/2017 with 4 polyps removed.   Labs: 09/2017: HGB 13.6 08/2017: LDL 76, A1c 7.2 05/2016: LFT normal  He comes in doing well today.  No symptoms concerning for angina.  No shortness of breath, palpitations, diaphoresis, nausea, vomiting, dizziness, presyncope, or syncope.  No recent falls.  No BRBPR or melena.  Tolerating all medications without issues.  Blood pressure remains well controlled in the 120s over 60s with a heart rate in the 60s bpm at home.  No orthopnea, PND, abdominal distention, lower extremity swelling, or early satiety.  He would like to be more active though his right knee is limiting this.  He has not tried hydrotherapy.  He does mention Eliquis would cost him $105 per month or  $305 for 3 months.  He is okay with paying this price.  He cannot take Xarelto secondary to bleeding while on Xarelto.  He prefers to remain on Eliquis as this is working well for him.  He does not have any other concerns at this time.   Past Medical History:  Diagnosis Date  . Atrial flutter ([place])    s/p ablation  . Cancer (HCC)    squamous cell carcimnoma right leg  . Chronic atrial fibrillation (HCC)    a. CHA2DS2VASc = 5-->eliquis 5 bid. (h/o epistaxis and gingival bleeding on xarelto).  . Coronary artery disease    a. 1993 s/p CABG;  b. 08/2016 Lexi MV: no ischemia/infarct->low risk. EF 55-65%.  . Diabetes mellitus without complication ([place])   . Dyspnea on exertion    a. 08/2016 Echo: EF 55-60%, no rwma, mild MR, mildly dil LA/RA, mild MR.  [name] GERD (gastroesophageal reflux disease)   . Hyperlipidemia    Mixed  . Hypertension    Unspecified  . 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 Osteoarthritis, knee   . PAT (paroxysmal atrial tachycardia) ([place])   . PSVT (paroxysmal supraventricular tachycardia) ([place])     Past Surgical History:  Procedure Laterality Date  . CARDIAC CATHETERIZATION  1993  . CARDIAC ELECTROPHYSIOLOGY STUDY AND ABLATION    . CARDIAC SURGERY     by pass  . COLONOSCOPY WITH PROPOFOL N/A 10/14/2016   Procedure: COLONOSCOPY WITH PROPOFOL;  Surgeon: Manya Silvas, MD;  Location: [facility]  ENDOSCOPY;  Service: Endoscopy;  Laterality: N/A;  . COLONOSCOPY WITH PROPOFOL N/A 10/03/2017   Procedure: COLONOSCOPY WITH PROPOFOL;  Surgeon: [name], MD;  Location: [place];  Service: Endoscopy;  Laterality: N/A;  Diabetic - oral meds  . CORONARY ARTERY BYPASS GRAFT  1993   CABG x 5 @ [facility]  . ESOPHAGOGASTRODUODENOSCOPY (EGD) WITH PROPOFOL N/A 10/14/2016   Procedure: ESOPHAGOGASTRODUODENOSCOPY (EGD) WITH PROPOFOL;  Surgeon: Manya Silvas, MD;  Location: [facility] ENDOSCOPY;  Service: Endoscopy;  Laterality: N/A;  . HEMORRHOID SURGERY    . MOHS  SURGERY    . POLYPECTOMY N/A 10/03/2017   Procedure: POLYPECTOMY;  Surgeon: [name], MD;  Location: [place];  Service: Endoscopy;  Laterality: N/A;    Current Meds  Medication Sig  . amLODipine (NORVASC) 10 MG tablet Take 10 mg by mouth daily.   [name] apixaban (ELIQUIS) 5 MG TABS tablet Take 5 mg by mouth 2 (two) times daily.  [name] atenolol (TENORMIN) 50 MG tablet Take 50 mg by mouth daily.   [name] atorvastatin (LIPITOR) 80 MG tablet Take 80 mg by mouth daily.   [name] ezetimibe (ZETIA) 10 MG tablet Take 1 tablet (10 mg total) by mouth daily. (Patient taking differently: Take 10 mg by mouth every evening. )  . hydrochlorothiazide (HYDRODIURIL) 25 MG tablet Take 25 mg by mouth daily.   . metFORMIN (GLUCOPHAGE) 500 MG tablet Take 500 mg by mouth 2 (two) times daily with a meal.   . Potassium Chloride ER 20 MEQ TBCR Take 20 mEq by mouth 2 (two) times daily.     Allergies:   Xarelto [rivaroxaban]   Social History:  The patient  reports that he has never smoked. He has never used smokeless tobacco. He reports that he does not drink alcohol or use drugs.   Family History:  The patient's family history includes Coronary artery disease in his other; Heart attack (age of onset: 37) in his father; Heart disease in his father.  ROS:   Review of Systems  Constitutional: Negative for chills, diaphoresis, fever, malaise/fatigue and weight loss.  HENT: Negative for congestion.   Eyes: Negative for discharge and redness.  Respiratory: Negative for cough, hemoptysis, sputum production, shortness of breath and wheezing.   Cardiovascular: Negative for chest pain, palpitations, orthopnea, claudication, leg swelling and PND.  Gastrointestinal: Negative for abdominal pain, blood in stool, heartburn, melena, nausea and vomiting.  Genitourinary: Negative for hematuria.  Musculoskeletal: Positive for joint pain. Negative for falls and myalgias.       Right knee  Skin: Negative for rash.  Neurological:  Negative for dizziness, tingling, tremors, sensory change, speech change, focal weakness, loss of consciousness and weakness.  Endo/Heme/Allergies: Does not bruise/bleed easily.  Psychiatric/Behavioral: Negative for substance abuse. The patient is not nervous/anxious.      PHYSICAL EXAM:  VS:  BP 126/62 (BP Location: Left Arm, Patient Position: Sitting, Cuff Size: Normal)   Pulse 66   Ht 6\' 4"  (1.93 m)   Wt 198 lb 8 oz (90 kg)   BMI 24.16 kg/m  BMI: Body mass index is 24.16 kg/m.  Physical Exam  Constitutional: He is oriented to person, place, and time. He appears well-developed and well-nourished.  HENT:  Head: Normocephalic and atraumatic.  Eyes: Right eye exhibits no discharge. Left eye exhibits no discharge.  Neck: Normal range of motion. No JVD present.  Cardiovascular: Normal rate, S1 normal, S2 normal and normal heart sounds. An irregularly irregular rhythm present. Exam reveals no distant heart  sounds, no friction rub, no midsystolic click and no opening snap.  No murmur heard. Pulses:      Posterior tibial pulses are 2+ on the right side, and 2+ on the left side.  Pulmonary/Chest: Effort normal and breath sounds normal. No respiratory distress. He has no decreased breath sounds. He has no wheezes. He has no rales. He exhibits no tenderness.  Abdominal: Soft. He exhibits no distension. There is no tenderness.  Musculoskeletal: He exhibits no edema.  Neurological: He is alert and oriented to person, place, and time.  Skin: Skin is warm and dry. No cyanosis. Nails show no clubbing.  Psychiatric: He has a normal mood and affect. His speech is normal and behavior is normal. Judgment and thought content normal.     EKG:  Was ordered and interpreted by me today. Shows A. fib, 66 bpm, no acute ST-T changes  Recent Labs: 09/16/2017: Hemoglobin 13.6; Platelets 161  No results found for requested labs within last 8760 hours.   CrCl cannot be calculated (Patient's most recent lab  result is older than the maximum 21 days allowed.).   Wt Readings from Last 3 Encounters:  10/27/17 198 lb 8 oz (90 kg)  10/03/17 194 lb (88 kg)  09/16/17 204 lb 9.6 oz (92.8 kg)     Other studies reviewed: Additional studies/records reviewed today include: summarized above  ASSESSMENT AND PLAN:  1. CAD of the native coronary arteries without angina: No symptoms concerning for chest pain.  He continues to do quite well.  On Eliquis in place of aspirin.  Continue Lipitor, Zetia, and atenolol.  Aggressive secondary prevention.  No plans for further ischemic evaluation at this time.  2. Chronic A. fib: Ventricular rate is well controlled.  He is a symptom Runner, broadcasting/film/video.  He prefers to remain on Eliquis even with the cost mention as above.  He is hesitant to transition to Pradaxa given his prior bleeding history with Xarelto.  We have provided samples.  Hopefully once he is out of the donut hole at the first of the year Eliquis will be cheaper for him.  I advised him should he have difficulty and pain for Eliquis to let us know for [place] to reassess.  Continue atenolol for rate control.  3. Hypertension: Blood pressure is well controlled.  Continue amlodipine and atenolol.  4. Hyperlipidemia: LDL is essentially at goal as above.  Continue atorvastatin and Zetia.  5. History of GI bleed following polypectomy: No melena or BPR noted.  Check CBC.  Disposition: F/u with Dr. Fletcher Anon or APP in 6 months.  Current medicines are reviewed at length with the patient today.  The patient did not have any concerns regarding medicines.  Signed, Christell Faith, PA-C 10/27/2017 11:26 AM     [facility] [address] [place], [place] [address] [phone]

## 2017-10-27 ENCOUNTER — Ambulatory Visit: Payer: Medicare Other | Admitting: Physician Assistant

## 2017-10-27 ENCOUNTER — Encounter: Payer: Self-pay | Admitting: Physician Assistant

## 2017-10-27 VITALS — BP 126/62 | HR 66 | Ht 76.0 in | Wt 198.5 lb

## 2017-10-27 DIAGNOSIS — E785 Hyperlipidemia, unspecified: Secondary | ICD-10-CM

## 2017-10-27 DIAGNOSIS — I10 Essential (primary) hypertension: Secondary | ICD-10-CM | POA: Diagnosis not present

## 2017-10-27 DIAGNOSIS — I482 Chronic atrial fibrillation, unspecified: Secondary | ICD-10-CM

## 2017-10-27 DIAGNOSIS — Z8719 Personal history of other diseases of the digestive system: Secondary | ICD-10-CM

## 2017-10-27 DIAGNOSIS — I2510 Atherosclerotic heart disease of native coronary artery without angina pectoris: Secondary | ICD-10-CM | POA: Diagnosis not present

## 2017-10-27 NOTE — Patient Instructions (Addendum)
Medication Instructions:  Medication Samples have been provided to the patient.  Drug name: Eliquis       Strength: 5 mg        Qty: 2 boxes  LOT: [id number]  Exp.Date: Jun 2021  Labwork: CBC done today and we will call you with those results.    Follow-Up: Your physician wants you to follow-up in: 6 months with Dr. Fletcher Anon. You will receive a reminder letter in the mail two months in advance. If you don't receive a letter, please call our office to schedule the follow-up appointment.  It was a pleasure seeing you today here in the office. Please do not hesitate to give [place] a call back if you have any further questions. [place], BSN

## 2017-10-28 LAB — CBC WITH DIFFERENTIAL/PLATELET
Basophils Absolute: 0 10*3/uL (ref 0.0–0.2)
Basos: 0 %
EOS (ABSOLUTE): 0.2 10*3/uL (ref 0.0–0.4)
Eos: 4 %
HEMOGLOBIN: 13.7 g/dL (ref 13.0–17.7)
Hematocrit: 40.2 % (ref 37.5–51.0)
IMMATURE GRANS (ABS): 0 10*3/uL (ref 0.0–0.1)
IMMATURE GRANULOCYTES: 0 %
LYMPHS: 31 %
Lymphocytes Absolute: 1.6 10*3/uL (ref 0.7–3.1)
MCH: 29.3 pg (ref 26.6–33.0)
MCHC: 34.1 g/dL (ref 31.5–35.7)
MCV: 86 fL (ref 79–97)
MONOCYTES: 10 %
Monocytes Absolute: 0.5 10*3/uL (ref 0.1–0.9)
NEUTROS ABS: 2.9 10*3/uL (ref 1.4–7.0)
NEUTROS PCT: 55 %
Platelets: 174 10*3/uL (ref 150–450)
RBC: 4.67 x10E6/uL (ref 4.14–5.80)
RDW: 14.2 % (ref 12.3–15.4)
WBC: 5.2 10*3/uL (ref 3.4–10.8)

## 2017-12-19 ENCOUNTER — Inpatient Hospital Stay: Payer: Medicare Other | Attending: Oncology

## 2017-12-19 DIAGNOSIS — D509 Iron deficiency anemia, unspecified: Secondary | ICD-10-CM | POA: Insufficient documentation

## 2017-12-19 DIAGNOSIS — D696 Thrombocytopenia, unspecified: Secondary | ICD-10-CM | POA: Insufficient documentation

## 2017-12-19 LAB — CBC WITH DIFFERENTIAL/PLATELET
Basophils Absolute: 0 10*3/uL (ref 0–0.1)
Basophils Relative: 1 %
Eosinophils Absolute: 0.2 10*3/uL (ref 0–0.7)
Eosinophils Relative: 4 %
HCT: 39 % — ABNORMAL LOW (ref 40.0–52.0)
HEMOGLOBIN: 13.3 g/dL (ref 13.0–18.0)
LYMPHS ABS: 1.7 10*3/uL (ref 1.0–3.6)
LYMPHS PCT: 29 %
MCH: 30 pg (ref 26.0–34.0)
MCHC: 34.1 g/dL (ref 32.0–36.0)
MCV: 88.1 fL (ref 80.0–100.0)
Monocytes Absolute: 0.6 10*3/uL (ref 0.2–1.0)
Monocytes Relative: 10 %
NEUTROS ABS: 3.3 10*3/uL (ref 1.4–6.5)
NEUTROS PCT: 56 %
Platelets: 157 10*3/uL (ref 150–440)
RBC: 4.43 MIL/uL (ref 4.40–5.90)
RDW: 14.4 % (ref 11.5–14.5)
WBC: 5.9 10*3/uL (ref 3.8–10.6)

## 2018-06-14 ENCOUNTER — Ambulatory Visit: Payer: Medicare Other | Admitting: Nurse Practitioner

## 2018-07-06 ENCOUNTER — Telehealth: Payer: Self-pay

## 2018-07-06 NOTE — Telephone Encounter (Signed)
TELEPHONE CALL NOTE  Walter Mosley. has been deemed a candidate for a follow-up tele-health visit to limit community exposure during the Covid-19 pandemic. I spoke with the patient via phone to ensure availability of phone/video source, confirm preferred email & phone number, and discuss instructions and expectations.  I reminded Walter Flor. to be prepared with any vital sign and/or heart rhythm information that could potentially be obtained via home monitoring, at the time of his visit. I reminded Walter Flor. to expect an e-mail containing a link for their video-based visit approximately 15 minutes before his visit, or alternatively, a phone call at the time of his visit if his visit is planned to be a phone encounter.  STAFF MUST READ CONSENT VERBATIM TO PATIENT BELOW - Did the patient verbally consent to treatment as below? YES  Walter Mosley, [place] 07/06/2018 10:16 AM   CONSENT FOR TELE-HEALTH VISIT - PLEASE REVIEW  I hereby voluntarily request, consent and authorize [facility] and its employed or contracted physicians, physician assistants, nurse practitioners or other licensed health care professionals (the Practitioner), to provide me with telemedicine health care services (the "Services") as deemed necessary by the treating Practitioner. I acknowledge and consent to receive the Services by the Practitioner via telemedicine. I understand that the telemedicine visit will involve communicating with the Practitioner through live audiovisual communication technology and the disclosure of certain medical information by electronic transmission. I acknowledge that I have been given the opportunity to request an in-person assessment or other available alternative prior to the telemedicine visit and am voluntarily participating in the telemedicine visit.  I understand that I have the right to withhold or withdraw my consent to the use of telemedicine in the course of my care at any  time, without affecting my right to future care or treatment, and that the Practitioner or I may terminate the telemedicine visit at any time. I understand that I have the right to inspect all information obtained and/or recorded in the course of the telemedicine visit and may receive copies of available information for a reasonable fee.  I understand that some of the potential risks of receiving the Services via telemedicine include:  [name] Delay or interruption in medical evaluation due to technological equipment failure or disruption; . Information transmitted may not be sufficient (e.g. poor resolution of images) to allow for appropriate medical decision making by the Practitioner; and/or  . In rare instances, security protocols could fail, causing a breach of personal health information.  Furthermore, I acknowledge that it is my responsibility to provide information about my medical history, conditions and care that is complete and accurate to the best of my ability. I acknowledge that Practitioner's advice, recommendations, and/or decision may be based on factors not within their control, such as incomplete or inaccurate data provided by me or distortions of diagnostic images or specimens that may result from electronic transmissions. I understand that the practice of medicine is not an exact science and that Practitioner makes no warranties or guarantees regarding treatment outcomes. I acknowledge that I will receive a copy of this consent concurrently upon execution via email to the email address I last provided but may also request a printed copy by calling the office of [facility].    I understand that my insurance will be billed for this visit.   I have read or had this consent read to me. . I understand the contents of this consent, which adequately explains  the benefits and risks of the Services being provided via telemedicine.  . I have been provided ample opportunity to ask questions  regarding this consent and the Services and have had my questions answered to my satisfaction. . I give my informed consent for the services to be provided through the use of telemedicine in my medical care  By participating in this telemedicine visit I agree to the above.

## 2018-07-13 ENCOUNTER — Telehealth (INDEPENDENT_AMBULATORY_CARE_PROVIDER_SITE_OTHER): Payer: Medicare Other | Admitting: Nurse Practitioner

## 2018-07-13 ENCOUNTER — Other Ambulatory Visit: Payer: Self-pay

## 2018-07-13 ENCOUNTER — Encounter: Payer: Self-pay | Admitting: Nurse Practitioner

## 2018-07-13 VITALS — BP 130/74 | HR 66 | Wt 202.0 lb

## 2018-07-13 DIAGNOSIS — I2510 Atherosclerotic heart disease of native coronary artery without angina pectoris: Secondary | ICD-10-CM | POA: Diagnosis not present

## 2018-07-13 DIAGNOSIS — I4821 Permanent atrial fibrillation: Secondary | ICD-10-CM

## 2018-07-13 DIAGNOSIS — E785 Hyperlipidemia, unspecified: Secondary | ICD-10-CM

## 2018-07-13 DIAGNOSIS — I10 Essential (primary) hypertension: Secondary | ICD-10-CM

## 2018-07-13 NOTE — Progress Notes (Signed)
Virtual Visit via Telephone Note   Evaluation Performed:  Follow-up visit  This visit type was conducted due to national recommendations for restrictions regarding the COVID-19 Pandemic (e.g. social distancing).  This format is felt to be most appropriate for this patient at this time.  All issues noted in this document were discussed and addressed.  No physical exam was performed (except for noted visual exam findings with Video Visits).  Please refer to the patient's chart (MyChart message for video visits and phone note for telephone visits) for the patient's consent to telehealth for [facility]. _____________   Date:  07/13/2018   Patient ID:  Walter Flor., DOB 21-Jun-1940, MRN [id number] Patient Location:  Home Provider location:   Home  Primary Care Provider:  Theotis Burrow, MD Primary Cardiologist:  Kathlyn Sacramento, MD  Chief Complaint    78 y/o ? with a h/o CAD s/p remote CABG (1993), HTN, HL, DMII, and chornic Afib on eliquis, who presents for telephonic f/u related to CAD.  Past Medical History    Past Medical History:  Diagnosis Date   Atrial flutter ([place])    s/p ablation   Cancer ([place])    squamous cell carcimnoma right leg   Chronic atrial fibrillation    a. CHA2DS2VASc = 5-->eliquis 5 bid. (h/o epistaxis and gingival bleeding on xarelto).   Coronary artery disease    a. 1993 s/p CABG;  b. 08/2016 Lexi MV: no ischemia/infarct->low risk. EF 55-65%.   Diabetes mellitus without complication ([place])    Dyspnea on exertion    a. 08/2016 Echo: EF 55-60%, no rwma, mild MR, mildly dil LA/RA, mild MR.   GERD (gastroesophageal reflux disease)    Hyperlipidemia    Mixed   Hypertension    Unspecified   MRSA (methicillin resistant staph aureus) culture positive    Osteoarthritis, knee    PAT (paroxysmal atrial tachycardia) (HCC)    PSVT (paroxysmal supraventricular tachycardia) ([place])    Past Surgical History:  Procedure Laterality Date    [place]     by pass   COLONOSCOPY WITH PROPOFOL N/A 10/14/2016   Procedure: COLONOSCOPY WITH PROPOFOL;  Surgeon: Manya Silvas, MD;  Location: [facility] ENDOSCOPY;  Service: Endoscopy;  Laterality: N/A;   COLONOSCOPY WITH PROPOFOL N/A 10/03/2017   Procedure: COLONOSCOPY WITH PROPOFOL;  Surgeon: [name], MD;  Location: [place];  Service: Endoscopy;  Laterality: N/A;  Diabetic - oral meds   CORONARY ARTERY BYPASS GRAFT  1993   CABG x 5 @ [facility]   ESOPHAGOGASTRODUODENOSCOPY (EGD) WITH PROPOFOL N/A 10/14/2016   Procedure: ESOPHAGOGASTRODUODENOSCOPY (EGD) WITH PROPOFOL;  Surgeon: Manya Silvas, MD;  Location: [facility] ENDOSCOPY;  Service: Endoscopy;  Laterality: N/A;   HEMORRHOID SURGERY     MOHS SURGERY     POLYPECTOMY N/A 10/03/2017   Procedure: POLYPECTOMY;  Surgeon: [name], MD;  Location: [place];  Service: Endoscopy;  Laterality: N/A;    Allergies  Allergies  Allergen Reactions   Xarelto [Rivaroxaban]     Other reaction(s): Bleeding gums    History of Present Illness    Walter Mosley. is a 78 y.o. male who presents via audio/video conferencing for a telehealth visit today.  As above, he has a h/o CAD s/p CABG in 1993.  Other hx includes aflutter s/p rfca in 2010, chronic rate-controlled afib on eliquis, HTN, HL, DMII, GERD, OA.  Last  echo in 08/2016 showed nl LV fxn, while stress testing @ that time was non-ischemic.  He was last seen in clnic in 10/2017, @ which time he was doing well.  Since his last visit, he has continued to do well.  He is mostly staying @ home now in the setting of shelter in place orders related to Covid-19.  He denies chest pain, palpitations, dyspnea, pnd, orthopnea, n, v, dizziness, syncope, edema, weight gain, or early satiety. He is tolerating meds well and is pleased that eliquis has not caused any of the bleeding issues that he had  prev experienced w/ xarelto.  He has not had any symptoms concerning for COVID-19 infection (fever, chills, cough, or new shortness of breath).   Home Medications    Prior to Admission medications   Medication Sig Start Date End Date Taking? Authorizing Provider  amLODipine (NORVASC) 10 MG tablet Take 10 mg by mouth daily.    Yes [provider]  apixaban (ELIQUIS) 5 MG TABS tablet Take 5 mg by mouth 2 (two) times daily.   Yes [provider]  atenolol (TENORMIN) 50 MG tablet Take 50 mg by mouth daily.    Yes [provider]  ezetimibe (ZETIA) 10 MG tablet Take 1 tablet (10 mg total) by mouth daily. Patient taking differently: Take 10 mg by mouth at bedtime.  01/17/15  Yes Wellington Hampshire, MD  hydrochlorothiazide (HYDRODIURIL) 25 MG tablet Take 25 mg by mouth daily.    Yes [provider]  metFORMIN (GLUCOPHAGE) 500 MG tablet Take 500 mg by mouth 2 (two) times daily with a meal.    Yes [provider]  potassium chloride (K-DUR,KLOR-CON) 10 MEQ tablet Take 10 mEq by mouth 2 (two) times daily.   Yes [provider]  atorvastatin (LIPITOR) 80 MG tablet Take 80 mg by mouth daily.     [provider]    Review of Systems    He denies chest pain, palpitations, dyspnea, pnd, orthopnea, n, v, dizziness, syncope, edema, weight gain, or early satiety. Further, he denies cough, fever, myalgias, or anosmia.  All other systems reviewed and are otherwise negative except as noted above.  Physical Exam    Vital Signs:  BP 130/74 (BP Location: Right Arm, Patient Position: Sitting)    Pulse 66    Wt 202 lb (91.6 kg)    BMI 24.59 kg/m    Pleasant, AAOx3. Nl affect. NAD noted over phone.  Accessory Clinical Findings     Lab Results  Component Value Date   CREATININE 0.98 10/24/2016   BUN 22 (H) 10/24/2016   NA 137 10/24/2016   K 4.1 10/24/2016   CL 102 10/24/2016   CO2 27 10/24/2016   Lab Results  Component Value Date   WBC 5.9  12/19/2017   HGB 13.3 12/19/2017   HCT 39.0 (L) 12/19/2017   MCV 88.1 12/19/2017   PLT 157 12/19/2017   Lab Results  Component Value Date   CHOL 134 05/27/2016   HDL 32 (L) 05/27/2016   LDLCALC 79 05/27/2016   TRIG 116 05/27/2016   CHOLHDL 4.2 05/27/2016     Assessment & Plan    1.  CAD: s/p remote CABG @ [facility] in 1993.  Last stress test was in 08/2016 and was non-ischemic w/ nl EF by echo @ that time.  He has continued to do well w/o chest pain or dyspnea.  He remains on  blocker and statin rx.  No asa in the setting  of chronic eliquis.  2.  Permanent Afib:  Asymptomatic.  Rate controlled on  blocker. Anticoagulated w/ eliquis 5mg  bid.  Renal fxn and cbc stable in summer 2019.  He will need f/u within the next couple of months.  In the setting of the Covid-19 pandemic, can hold off until social distancing restrictions eased in a few months.  3.  Essential HTN:  Stable on  blocker, hctz, and amlodipine.  4.  HL:  LDL 79 in 05/2016.  On high potency statin and zetia.  Will need f/u lipids and lft's once feasible and if LDL still not @ goal, would look to add pcsk9i.  5.  DMII:  On metformin and followed by PCP.  COVID-19 Education: The signs and symptoms of COVID-19 were discussed with the patient and how to seek care for testing (follow up with PCP or arrange E-visit).  The importance of social distancing was discussed today.  Patient Risk:   After full review of this patient's history and clinical status, I feel that he is at least moderate risk for cardiac complications at this time, thus necessitating a telehealth visit sooner than our first available in office visit.  Time:   Today, I have spent 15 minutes with the patient with telehealth technology discussing symptoms, covid-19 in our community, and plan of care.  Disposition:  F/u with Dr. Fletcher Anon in 6 mos or sooner if necessary.    Murray Hodgkins, NP 07/13/2018, 11:25 AM

## 2018-07-13 NOTE — Patient Instructions (Signed)
It was a pleasure to speak with you on the phone today! Thank you for allowing [place] to continue taking care of your [facility] needs during this time.   Feel free to call as needed for questions and concerns related to your cardiac needs.   Medication Instructions:  Your physician recommends that you continue on your current medications as directed. Please refer to the Current Medication list given to you today.  If you need a refill on your cardiac medications before your next appointment, please call your pharmacy.   Lab work: None ordered  If you have labs (blood work) drawn today and your tests are completely normal, you will receive your results only by: [name] MyChart Message (if you have MyChart) OR . A paper copy in the mail If you have any lab test that is abnormal or we need to change your treatment, we will call you to review the results.  Testing/Procedures: None ordered   Follow-Up: At [facility], you and your health needs are our priority.  As part of our continuing mission to provide you with exceptional heart care, we have created designated Provider Care Teams.  These Care Teams include your primary Cardiologist (physician) and Advanced Practice Providers (APPs -  Physician Assistants and Nurse Practitioners) who all work together to provide you with the care you need, when you need it. You will need a follow up appointment in 6 months.  Please call our office 2 months in advance to schedule this appointment.  Please see Kathlyn Sacramento, MD.

## 2019-04-04 ENCOUNTER — Ambulatory Visit (INDEPENDENT_AMBULATORY_CARE_PROVIDER_SITE_OTHER): Payer: Medicare Other

## 2019-04-04 ENCOUNTER — Other Ambulatory Visit: Payer: Self-pay

## 2019-04-04 ENCOUNTER — Ambulatory Visit
Admission: EM | Admit: 2019-04-04 | Discharge: 2019-04-04 | Disposition: A | Discharge status: Home / Self Care | Payer: Medicare Other | Attending: Urgent Care | Admitting: Urgent Care

## 2019-04-04 DIAGNOSIS — R0602 Shortness of breath: Secondary | ICD-10-CM | POA: Diagnosis not present

## 2019-04-04 DIAGNOSIS — Z20828 Contact with and (suspected) exposure to other viral communicable diseases: Secondary | ICD-10-CM | POA: Diagnosis present

## 2019-04-04 DIAGNOSIS — R0600 Dyspnea, unspecified: Secondary | ICD-10-CM | POA: Diagnosis not present

## 2019-04-04 DIAGNOSIS — Z7189 Other specified counseling: Secondary | ICD-10-CM | POA: Diagnosis present

## 2019-04-04 DIAGNOSIS — I482 Chronic atrial fibrillation, unspecified: Secondary | ICD-10-CM | POA: Diagnosis present

## 2019-04-04 DIAGNOSIS — R0609 Other forms of dyspnea: Secondary | ICD-10-CM

## 2019-04-04 DIAGNOSIS — Z20822 Contact with and (suspected) exposure to covid-19: Secondary | ICD-10-CM

## 2019-04-04 LAB — CBC WITH DIFFERENTIAL/PLATELET
Abs Immature Granulocytes: 0.07 10*3/uL (ref 0.00–0.07)
Basophils Absolute: 0 10*3/uL (ref 0.0–0.1)
Basophils Relative: 0 %
Eosinophils Absolute: 0.2 10*3/uL (ref 0.0–0.5)
Eosinophils Relative: 2 %
HCT: 37 % — ABNORMAL LOW (ref 39.0–52.0)
Hemoglobin: 12.5 g/dL — ABNORMAL LOW (ref 13.0–17.0)
Immature Granulocytes: 1 %
Lymphocytes Relative: 26 %
Lymphs Abs: 1.9 10*3/uL (ref 0.7–4.0)
MCH: 28.7 pg (ref 26.0–34.0)
MCHC: 33.8 g/dL (ref 30.0–36.0)
MCV: 85.1 fL (ref 80.0–100.0)
Monocytes Absolute: 0.7 10*3/uL (ref 0.1–1.0)
Monocytes Relative: 10 %
Neutro Abs: 4.6 10*3/uL (ref 1.7–7.7)
Neutrophils Relative %: 61 %
Platelets: 177 10*3/uL (ref 150–400)
RBC: 4.35 MIL/uL (ref 4.22–5.81)
RDW: 13.6 % (ref 11.5–15.5)
WBC: 7.5 10*3/uL (ref 4.0–10.5)
nRBC: 0 % (ref 0.0–0.2)

## 2019-04-04 LAB — BASIC METABOLIC PANEL
Anion gap: 7 (ref 5–15)
BUN: 13 mg/dL (ref 8–23)
CO2: 26 mmol/L (ref 22–32)
Calcium: 8.9 mg/dL (ref 8.9–10.3)
Chloride: 103 mmol/L (ref 98–111)
Creatinine, Ser: 1.04 mg/dL (ref 0.61–1.24)
GFR calc Af Amer: >60 mL/min (ref >60)
GFR calc non Af Amer: >60 mL/min (ref >60)
Glucose, Bld: 116 mg/dL — ABNORMAL HIGH (ref 70–99)
Potassium: 3.6 mmol/L (ref 3.5–5.1)
Sodium: 136 mmol/L (ref 135–145)

## 2019-04-04 LAB — TROPONIN I (HIGH SENSITIVITY): Troponin I (High Sensitivity): 7 ng/L (ref <18)

## 2019-04-04 NOTE — Discharge Instructions (Addendum)
It was very nice seeing you today in clinic. Thank you for entrusting me with your care.   WORK UP TODAY: EKG shows baseline A.fib CXR normal CBC with diff, BMP, and high sensitivity troponin all  normal  Make arrangements to follow up with your regular doctor and/or cardiologist for further evaluation if not improving. If your symptoms/condition worsens, please seek follow up care either here or in the ER. Please remember, our [place] providers are "right here with you" when you need [place].   Again, it was my pleasure to take care of you today. Thank you for choosing our clinic. I hope that you start to feel better quickly.   Honor Loh, MSN, APRN, FNP-C, CEN Advanced Practice Provider [facility]

## 2019-04-04 NOTE — ED Triage Notes (Signed)
Patient complains of shortness of breath that is worse with walking x 7 days. Patient states that he CABG in 1993 and feels like his symptoms are similar.

## 2019-04-06 LAB — NOVEL CORONAVIRUS, NAA (HOSP ORDER, SEND-OUT TO REF LAB; TAT 18-24 HRS): SARS-CoV-2, NAA: NOT DETECTED

## 2019-04-06 NOTE — ED Provider Notes (Signed)
[place], [place]   Name: Walter Mosley. DOB: Jan 05, 1941 MRN: [id number] CSN: [id number] PCP: Theotis Burrow, MD  Arrival date and time:  04/04/19 1748  Chief Complaint:  Shortness of Breath   NOTE: Prior to seeing the patient today, I have reviewed the triage nursing documentation and vital signs. Clinical staff has updated patient's PMH/PSHx, current medication list, and drug allergies/intolerances to ensure comprehensive history available to assist in medical decision making.   History:   HPI: Walter Trucks. is a 78 y.o. male who presents today with complaints of increased shortness of breath over the course of the last 1.5 weeks. Breathing worsens with exertion. Oxygen saturation 93% today when walking from lobby to exam room; improves to 98% at rest. Patient denies and associated chest pain, nausea, vomiting, or diaphoresis. Initial appointment made by family member under the chief complaint "dizziness". Patient advising that the appointment was made my his grandson who is a Marine scientist. He states, "I am not dizzy. I get lightheaded sometimes when I walk and get short of breath. I do not call that dizzy though". PMH (+) for remote five vessel CABG and permanent atrial fibrillation; followed by cardiology Fletcher Anon, MD). CHA2DS2-VASc score 5; on daily DOAC therapy using apixaban. Last echocardiogram was in 2018; EF 55-60% with no RWMAs; mild MVR and TVR. In the wake of the current SARS-CoV-2 (novel coronavirus) pandemic, patient was tested for the virus a few days ago at the health department, and negative results were received today. He advises in clinic today that his grandson does not feel as if the test was collected appropriately; "he thinks they did not go in deep enough when they swabbed my nose". Patient not really interested in being retested today.  Past Medical History:  Diagnosis Date   Atrial flutter ([place])    s/p ablation   Cancer ([place])    squamous cell carcimnoma right leg     Chronic atrial fibrillation (HCC)    a. CHA2DS2VASc = 5-->eliquis 5 bid. (h/o epistaxis and gingival bleeding on xarelto).   Coronary artery disease    a. 1993 s/p CABG;  b. 08/2016 Lexi MV: no ischemia/infarct->low risk. EF 55-65%.   Diabetes mellitus without complication ([place])    Dyspnea on exertion    a. 08/2016 Echo: EF 55-60%, no rwma, mild MR, mildly dil LA/RA, mild MR.   GERD (gastroesophageal reflux disease)    Hyperlipidemia    Mixed   Hypertension    Unspecified   MRSA (methicillin resistant staph aureus) culture positive    Osteoarthritis, knee    PAT (paroxysmal atrial tachycardia) (HCC)    PSVT (paroxysmal supraventricular tachycardia) ([place])     Past Surgical History:  Procedure Laterality Date   [place]     by pass   COLONOSCOPY WITH PROPOFOL N/A 10/14/2016   Procedure: COLONOSCOPY WITH PROPOFOL;  Surgeon: Manya Silvas, MD;  Location: [facility] ENDOSCOPY;  Service: Endoscopy;  Laterality: N/A;   COLONOSCOPY WITH PROPOFOL N/A 10/03/2017   Procedure: COLONOSCOPY WITH PROPOFOL;  Surgeon: [name], MD;  Location: [place];  Service: Endoscopy;  Laterality: N/A;  Diabetic - oral meds   CORONARY ARTERY BYPASS GRAFT  1993   CABG x 5 @ [facility]   ESOPHAGOGASTRODUODENOSCOPY (EGD) WITH PROPOFOL N/A 10/14/2016   Procedure: ESOPHAGOGASTRODUODENOSCOPY (EGD) WITH PROPOFOL;  Surgeon: Manya Silvas, MD;  Location: [facility] ENDOSCOPY;  Service: Endoscopy;  Laterality:  N/A;   HEMORRHOID SURGERY     MOHS SURGERY     POLYPECTOMY N/A 10/03/2017   Procedure: POLYPECTOMY;  Surgeon: [name], MD;  Location: [place];  Service: Endoscopy;  Laterality: N/A;    Family History  Problem Relation Age of Onset   Heart disease Father    Heart attack Father 29       MI   Coronary artery disease Other     Social History   Tobacco Use   Smoking status:  Never Smoker   Smokeless tobacco: Never Used  Substance Use Topics   Alcohol use: No    Comment: may have 1 beer/month   Drug use: No    Patient Active Problem List   Diagnosis Date Noted   Personal history of colonic polyps    Routine cervical smear    Polyp of sigmoid colon    Lower GI bleed    Melena 10/24/2016   Abscess    Cellulitis 02/11/2015   Chronic atrial fibrillation ([place]) 12/31/2013   Pre-syncope 11/16/2012   Coronary artery disease    Hyperlipidemia 01/29/2009   Essential hypertension 01/29/2009   CAD, ARTERY BYPASS GRAFT 01/29/2009   SVT/ PSVT/ PAT 01/29/2009   ATRIAL FLUTTER 01/29/2009    Home Medications:    Current Meds  Medication Sig   amLODipine (NORVASC) 10 MG tablet Take 10 mg by mouth daily.    apixaban (ELIQUIS) 5 MG TABS tablet Take 5 mg by mouth 2 (two) times daily.   atenolol (TENORMIN) 50 MG tablet Take 50 mg by mouth daily.    atorvastatin (LIPITOR) 80 MG tablet Take 80 mg by mouth daily.    ezetimibe (ZETIA) 10 MG tablet Take 1 tablet (10 mg total) by mouth daily. (Patient taking differently: Take 10 mg by mouth at bedtime. )   hydrochlorothiazide (HYDRODIURIL) 25 MG tablet Take 25 mg by mouth daily.    metFORMIN (GLUCOPHAGE) 500 MG tablet Take 500 mg by mouth 2 (two) times daily with a meal.    potassium chloride (K-DUR,KLOR-CON) 10 MEQ tablet Take 10 mEq by mouth 2 (two) times daily.   Zinc 30 MG TABS Take by mouth.    Allergies:   Xarelto [rivaroxaban]  Review of Systems (ROS): Review of Systems  Constitutional: Negative for fatigue and fever.  HENT: Negative for congestion, ear pain, postnasal drip, rhinorrhea, sinus pressure, sinus pain, sneezing and sore throat.   Eyes: Negative for pain, discharge and redness.  Respiratory: Positive for shortness of breath (mostly exertional). Negative for cough and chest tightness.   Cardiovascular: Positive for palpitations. Negative for chest pain and leg swelling.         PMH (+) A.fib  Gastrointestinal: Negative for abdominal pain, diarrhea, nausea and vomiting.  Musculoskeletal: Negative for arthralgias, back pain, myalgias and neck pain.  Skin: Negative for color change, pallor and rash.  Neurological: Positive for light-headedness. Negative for dizziness, syncope, weakness and headaches.  Hematological: Negative for adenopathy.     Vital Signs: Today's Vitals   04/04/19 1817 04/04/19 1821 04/04/19 1902 04/04/19 1929  BP:  (!) 154/82    Pulse:  75    Resp:  16    Temp:  98.1 F (36.7 C)    TempSrc:  Oral    SpO2:  93% 98%   Weight: 205 lb (93 kg)     Height: 6\' 4"  (1.93 m)     PainSc: 0-No pain   0-No pain    Physical Exam: Physical Exam  Constitutional: He is oriented to person, place, and time and well-developed, well-nourished, and in no distress.  HENT:  Head: Normocephalic and atraumatic.  Eyes: Pupils are equal, round, and reactive to light.  Neck: No tracheal deviation present.  Cardiovascular: Normal rate, normal heart sounds and intact distal pulses. An irregularly irregular rhythm present.  Pulmonary/Chest: Effort normal and breath sounds normal. No accessory muscle usage. No respiratory distress.  (+) DOE; see HPI  Abdominal: Soft. Normal appearance, normal aorta and bowel sounds are normal. He exhibits no distension. There is no abdominal tenderness.  Musculoskeletal:     Cervical back: Normal range of motion and neck supple.  Lymphadenopathy:    He has no cervical adenopathy.  Neurological: He is alert and oriented to person, place, and time. He has normal sensation, normal strength, normal reflexes and intact cranial nerves. Gait normal.  Skin: Skin is warm and dry. No rash noted. He is not diaphoretic.  Psychiatric: Mood, memory, affect and judgment normal.  Nursing note and vitals reviewed.   Urgent Care Treatments / Results:   Orders Placed This Encounter  Procedures   Novel Coronavirus, NAA (Hosp order,  Send-out to Ref Lab; TAT 18-24 hrs   DG Chest 2 View   CBC with Differential   Basic metabolic panel   ED EKG   EKG 12-Lead    LABS: PLEASE NOTE: all labs that were ordered this encounter are listed, however only abnormal results are displayed. Labs Reviewed  CBC WITH DIFFERENTIAL/PLATELET - Abnormal; Notable for the following components:      Result Value   Hemoglobin 12.5 (*)    HCT 37.0 (*)    All other components within normal limits  BASIC METABOLIC PANEL - Abnormal; Notable for the following components:   Glucose, Bld 116 (*)    All other components within normal limits  NOVEL CORONAVIRUS, NAA (HOSP ORDER, SEND-OUT TO REF LAB; TAT 18-24 HRS)  TROPONIN I (HIGH SENSITIVITY)    URGENT CARE ECG REPORT Date: 04/04/2019 Time ECG obtained: 1825 PM Rate: 80 bpm Rhythm: atrial fibrillation with PVCs Intervals: QTc prolonged at 482 ms ST segment and T wave changes: Non-specific T wave inversion in inferior leads  RADIOLOGY: DG Chest 2 View  Result Date: 04/04/2019 CLINICAL DATA:  Shortness of breath with walking for 1 week. History of CABG. EXAM: CHEST - 2 VIEW COMPARISON:  Radiographs 06/09/2008. FINDINGS: The heart size and mediastinal contours are stable status post CABG. There is moderate aortic atherosclerosis. There is mild scarring at the lung apices. The lungs are otherwise clear. There is no significant pleural effusion or pneumothorax. No acute osseous findings. IMPRESSION: Stable postoperative chest. No active cardiopulmonary process. Electronically Signed   By: Richardean Sale M.D.   On: 04/04/2019 19:01    PROCEDURES: Procedures  MEDICATIONS RECEIVED THIS VISIT: Medications - No data to display  PERTINENT CLINICAL COURSE NOTES/UPDATES:   Initial Impression / Assessment and Plan / Urgent Care Course:  Pertinent labs & imaging results that were available during my care of the patient were personally reviewed by me and considered in my medical decision making  (see lab/imaging section of note for values and interpretations).  Walter Bolam. is a 78 y.o. male who presents to [facility] today with complaints of Shortness of Breath   Patient is well appearing overall in clinic today. He does not appear to be in any acute distress. Presenting symptoms (see HPI) and exam as documented above. Symptoms have been the same for  the last 1.5 weeks; denies worsening. Given symptoms and history, will pursue further evaluation in clinic today.    EKG shows rate controlled A.fib with PVCs at a rate of 80 bpm. Non-specific T wave inversion noted in inferior leads. Patient with no chest pain, nausea, or diaphoresis. Reviewed with attending on duty Lacinda Axon, DO) who advised if patient is not having pain and hs-TnI is normal, have patient follow up with cardiologist.    Radiographs of the chest performed today revealed no acute cardiopulmonary process; no evidence of peribronchial thickening, areas of consolidation, or focal infiltrates.   CBC, BMP, and hs-TnI all normal.   Workup reviewed with patient. Discussed that while cardiac enzymes were normal in clinic today, ACS could not be fully ruled out in the urgent care setting. Given his symptoms, patient will likely benefit from a repeat echocardiogram +/- EST. Patient needs to be seen for further evaluation by Dr. Fletcher Anon. He notes frustration with video visits. He was advised to call the office and advise that he has been tested for SARS-CoV-2 (novel coronavirus), and his results are negative. There should be no precluding factors preventing a face to face visit. I advised him that I would also sent notes to Dr. Fletcher Anon and Sharolyn Douglas, NP to make them aware of his symptoms. Discussed that any worsening of his SOB, or the development of CP, nausea, vomiting, diaphoresis, weakness, or pain in his neck, shoulder, LUE, or sub-scapular area will necessitate further evaluation in the emergency room; verbalized understanding.  As  patient being discharged, it verbalizes that he would like to be re-tested for SARS-CoV-2 "just in case" the test was indeed improperly collected resulting in a false negative. Given his SOB, PMH, and age in the midst of current pandemic, confirmatory testing is certainly reasonable. SARS-CoV-2 swab collected by certified clinical staff. Discussed variable turn around times associated with testing, as swabs are being processed at [facility], and have been taking between 2-5 days to come back. He was advised to self quarantine, per [facility] DHHS guidelines, until negative results received. These measures are being implemented out of an abundance of caution to prevent transmission and spread during the current SARS-CoV-2 pandemic.  Discussed follow up with primary care physician and/or cardiologist ASAP for re-evaluation and ongoing management. I have reviewed the follow up and strict return precautions for any new or worsening symptoms. Patient is aware of symptoms that would be deemed urgent/emergent, and would thus require further evaluation either here or in the emergency department. At the time of discharge, he verbalized understanding and consent with the discharge plan as it was reviewed with him. All questions were fielded by provider and/or clinic staff prior to patient discharge.    Final Clinical Impressions / Urgent Care Diagnoses:   Final diagnoses:  DOE (dyspnea on exertion)  Chronic atrial fibrillation ([place])  Encounter for laboratory testing for COVID-19 virus  Advice given about COVID-19 virus infection    New Prescriptions:  [place] Controlled Substance Registry consulted? Not Applicable  No orders of the defined types were placed in this encounter.   Recommended Follow up Care:  Patient encouraged to follow up with the following provider within the specified time frame, or sooner as dictated by the severity of his symptoms. As always, he was instructed that for any urgent/emergent care needs,  he should seek care either here or in the emergency department for more immediate evaluation.  Follow-up Information    Call  [name], Elyse Jarvis, MD.   Specialty: [facility]  Contact information: [place] [address] [phone]        Call  Wellington Hampshire, MD.   Specialty: Cardiology Contact information: [place] [address] [phone]         NOTE: This note was prepared using Dragon dictation software along with smaller phrase technology. Despite my best ability to proofread, there is the potential that transcriptional errors may still occur from this process, and are completely unintentional.    Karen Kitchens, NP 04/06/19 1221

## 2019-04-11 ENCOUNTER — Other Ambulatory Visit: Payer: Self-pay

## 2019-04-11 ENCOUNTER — Ambulatory Visit (INDEPENDENT_AMBULATORY_CARE_PROVIDER_SITE_OTHER): Payer: Medicare Other | Admitting: Family

## 2019-04-11 ENCOUNTER — Encounter: Payer: Self-pay | Admitting: Family

## 2019-04-11 VITALS — BP 136/66 | HR 66 | Ht 76.0 in | Wt 208.0 lb

## 2019-04-11 DIAGNOSIS — Z7901 Long term (current) use of anticoagulants: Secondary | ICD-10-CM

## 2019-04-11 DIAGNOSIS — I4821 Permanent atrial fibrillation: Secondary | ICD-10-CM

## 2019-04-11 DIAGNOSIS — E785 Hyperlipidemia, unspecified: Secondary | ICD-10-CM

## 2019-04-11 DIAGNOSIS — R0609 Other forms of dyspnea: Secondary | ICD-10-CM

## 2019-04-11 DIAGNOSIS — I2510 Atherosclerotic heart disease of native coronary artery without angina pectoris: Secondary | ICD-10-CM

## 2019-04-11 DIAGNOSIS — R0600 Dyspnea, unspecified: Secondary | ICD-10-CM

## 2019-04-11 NOTE — Patient Instructions (Signed)
Medication Instructions:  No medication changes today.  *If you need a refill on your cardiac medications before your next appointment, please call your pharmacy*  Lab Work: No lab work today.  If you have labs (blood work) drawn today and your tests are completely normal, you will receive your results only by: [name] MyChart Message (if you have MyChart) OR . A paper copy in the mail If you have any lab test that is abnormal or we need to change your treatment, we will call you to review the results.  Testing/Procedures: You had an EKG today. It showed atrial fibrillation.   Your physician has requested that you have an echocardiogram. Echocardiography is a painless test that uses sound waves to create images of your heart. It provides your doctor with information about the size and shape of your heart and how well your heart's chambers and valves are working. This procedure takes approximately one hour. There are no restrictions for this procedure.  Follow-Up: At [facility], you and your health needs are our priority.  As part of our continuing mission to provide you with exceptional heart care, we have created designated Provider Care Teams.  These Care Teams include your primary Cardiologist (physician) and Advanced Practice Providers (APPs -  Physician Assistants and Nurse Practitioners) who all work together to provide you with the care you need, when you need it.  Your next appointment:   In September  The format for your next appointment:   In Person  Provider:    You may see Kathlyn Sacramento, MD or one of the following Advanced Practice Providers on your designated Care Team:    Murray Hodgkins, NP  Christell Faith, PA-C  Marrianne Mood, PA-C   Other Instructions

## 2019-04-11 NOTE — Progress Notes (Signed)
Office Visit    Patient Name: Walter Mosley. Date of Encounter: 04/11/2019  Primary Care Provider:  Theotis Burrow, MD Primary Cardiologist:  Kathlyn Sacramento, MD Electrophysiologist:  None   Chief Complaint    Walter Reno Clarnece Dort. is a 78 y.o. male with a hx of CAD s/p remote CABG (1993), HTN, HLD, DM 2, chronic atrial fibrillation on Eliquis presents today for follow-up after urgent care visit with DOE.  Past Medical History    Past Medical History:  Diagnosis Date  . Atrial flutter ([place])    s/p ablation  . Cancer (HCC)    squamous cell carcimnoma right leg  . Chronic atrial fibrillation (HCC)    a. CHA2DS2VASc = 5-->eliquis 5 bid. (h/o epistaxis and gingival bleeding on xarelto).  . Coronary artery disease    a. 1993 s/p CABG;  b. 08/2016 Lexi MV: no ischemia/infarct->low risk. EF 55-65%.  . Diabetes mellitus without complication ([place])   . Dyspnea on exertion    a. 08/2016 Echo: EF 55-60%, no rwma, mild MR, mildly dil LA/RA, mild MR.  [name] GERD (gastroesophageal reflux disease)   . Hyperlipidemia    Mixed  . Hypertension    Unspecified  . 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 Osteoarthritis, knee   . PAT (paroxysmal atrial tachycardia) ([place])   . PSVT (paroxysmal supraventricular tachycardia) ([place])    Past Surgical History:  Procedure Laterality Date  . CARDIAC CATHETERIZATION  1993  . CARDIAC ELECTROPHYSIOLOGY STUDY AND ABLATION    . CARDIAC SURGERY     by pass  . COLONOSCOPY WITH PROPOFOL N/A 10/14/2016   Procedure: COLONOSCOPY WITH PROPOFOL;  Surgeon: Manya Silvas, MD;  Location: [facility] ENDOSCOPY;  Service: Endoscopy;  Laterality: N/A;  . COLONOSCOPY WITH PROPOFOL N/A 10/03/2017   Procedure: COLONOSCOPY WITH PROPOFOL;  Surgeon: [name], MD;  Location: [place];  Service: Endoscopy;  Laterality: N/A;  Diabetic - oral meds  . CORONARY ARTERY BYPASS GRAFT  1993   CABG x 5 @ [place]  . ESOPHAGOGASTRODUODENOSCOPY (EGD) WITH  PROPOFOL N/A 10/14/2016   Procedure: ESOPHAGOGASTRODUODENOSCOPY (EGD) WITH PROPOFOL;  Surgeon: Manya Silvas, MD;  Location: [facility] ENDOSCOPY;  Service: Endoscopy;  Laterality: N/A;  . HEMORRHOID SURGERY    . MOHS SURGERY    . POLYPECTOMY N/A 10/03/2017   Procedure: POLYPECTOMY;  Surgeon: [name], MD;  Location: [place];  Service: Endoscopy;  Laterality: N/A;    Allergies  Allergies  Allergen Reactions  . Xarelto [Rivaroxaban]     Other reaction(s): Bleeding gums    History of Present Illness    Walter Mosley. is a 78 y.o. male with a hx of CAD s/p remote CABG (1993), HTN, HLD, DM 2, chronic atrial fibrillation on Eliquis last seen 07/13/2018 by Ignacia Bayley, NP.  Atrial flutter s/p RF CVA in 2010.  Last echo 08/2016 normal LVEF, mild MR, mild TR. Stress testing at that time was nonischemic.  Of note he had previous bleeding issues with Xarelto but has had none with Eliquis.  He presented to [facility] urgent care 04/04/2019 with increased dyspnea on exertion.  He gets lightheaded occasionally when he walks and is short of breath. EKG atrial fibrillation 80 bpm with nonspecific T wave inversion in inferior leads. chest x-ray with no acute findings.  CBC, BMP, hs-TnI all normal.   Tells me he had a 2-day history of dyspnea on exertion.  Tells me it resolved shortly after returning home  from the urgent care.  Tells me he also had a little bit of a cold at that time.  Tells me his grandson was worried about him but he is feeling much better now.  He denies chest pain, pressure, tightness.  He denies shortness of breath and DOE today.  He reports no wheeze.  Tells me he gets a cough "every once in a while ".  He denies lower extremity edema, orthopnea, PND.  He is unaware of his atrial fibrillation and he denies palpitations.  Tells me he previously had some palpitations but this resolved after he cut back his caffeine intake.  He reports no bleeding complications with his Eliquis.   He is somewhat concerned regarding the cost but is understanding of the importance to continue.  He requests samples today.  EKGs/Labs/Other Studies Reviewed:   The following studies were reviewed today:  Echo 08/2016 - Left ventricle: The cavity size was normal. There was mild   concentric hypertrophy. Systolic function was normal. The   estimated ejection fraction was in the range of 55% to 60%. Wall   motion was normal; there were no regional wall motion   abnormalities. The study is not technically sufficient to allow   evaluation of LV diastolic function. - Mitral valve: There was mild regurgitation. - Left atrium: The atrium was mildly dilated. - Right atrium: The atrium was mildly dilated. - Tricuspid valve: There was mild regurgitation. - Pulmonary arteries: Systolic pressure was within the normal   range.  EKG:  EKG is ordered today.  The ekg ordered today demonstrates atrial fibrillation 66 bpm with PVC and nonspecific T wave changes in the inferior leads that are overall stable compared to previous.  Recent Labs: 04/04/2019: BUN 13; Creatinine, Ser 1.04; Hemoglobin 12.5; Platelets 177; Potassium 3.6; Sodium 136  Recent Lipid Panel    Component Value Date/Time   CHOL 134 05/27/2016 0806   TRIG 116 05/27/2016 0806   HDL 32 (L) 05/27/2016 0806   CHOLHDL 4.2 05/27/2016 0806   CHOLHDL 6.0 CALC 06/24/2008 1202   VLDL 30 06/24/2008 1202   LDLCALC 79 05/27/2016 0806    Home Medications   Current Meds  Medication Sig  . amLODipine (NORVASC) 10 MG tablet Take 10 mg by mouth daily.   [name] apixaban (ELIQUIS) 5 MG TABS tablet Take 5 mg by mouth 2 (two) times daily.  [name] atenolol (TENORMIN) 50 MG tablet Take 50 mg by mouth daily.   [name] atorvastatin (LIPITOR) 80 MG tablet Take 80 mg by mouth daily.   [name] ezetimibe (ZETIA) 10 MG tablet Take 1 tablet (10 mg total) by mouth daily. (Patient taking differently: Take 10 mg by mouth at bedtime. )  . hydrochlorothiazide (HYDRODIURIL) 25 MG  tablet Take 25 mg by mouth daily.   . metFORMIN (GLUCOPHAGE) 500 MG tablet Take 500 mg by mouth 2 (two) times daily with a meal.   . potassium chloride (K-DUR,KLOR-CON) 10 MEQ tablet Take 10 mEq by mouth 2 (two) times daily.  . Zinc 30 MG TABS Take by mouth.      Review of Systems    Review of Systems  Constitution: Negative for chills, fever and malaise/fatigue.  Cardiovascular: Negative for chest pain, dyspnea on exertion, irregular heartbeat, leg swelling, near-syncope, orthopnea, palpitations and syncope.  Respiratory: Negative for cough, shortness of breath and wheezing.   Gastrointestinal: Negative for melena, nausea and vomiting.  Genitourinary: Negative for hematuria.  Neurological: Negative for dizziness, light-headedness and weakness.   All other systems  reviewed and are otherwise negative except as noted above.  Physical Exam    VS:  BP 136/66 (BP Location: Left Arm, Patient Position: Sitting, Cuff Size: Normal)   Pulse 66   Ht 6\' 4"  (1.93 m)   Wt 208 lb (94.3 kg)   SpO2 98%   BMI 25.32 kg/m  , BMI Body mass index is 25.32 kg/m. GEN: Well nourished, well developed, in no acute distress. HEENT: normal. Neck: Supple, no JVD, carotid bruits, or masses. Cardiac: RRR, no murmurs, rubs, or gallops. No clubbing, cyanosis, edema.  Radials/DP/PT 2+ and equal bilaterally.  Respiratory:  Respirations regular and unlabored, clear to auscultation bilaterally. GI: Soft, nontender, nondistended. MS: No deformity or atrophy. Skin: Warm and dry, no rash. Neuro:  Strength and sensation are intact. Psych: Normal affect.  Accessory Clinical Findings    ECG personally reviewed by me today -atrial fibrillation rate controlled 66 bpm with PVC and nonspecific T wave changes in inferior leads stable compared to previous.- no acute changes.  Assessment & Plan    1. DOE -2-day history of DOE in the setting of URI which has since resolved. Seen in ED with negative troponin, normal BMET,  CBC with Hb 12.5, CXR with no acute findings.   Lung sounds clear on exam.  We will plan to update echocardiogram for reassessment of LVEF as last echo 2018.  2. CAD s/p remote CABG at [facility] in [place] test 08/2016 and nonischemic with normal LVEF by echo at that time.  He reports no anginal symptoms today.  His EKG today shows nonspecific T wave changes to the inferior leads which are stable compared to previous.  No indication for ischemic evaluation at this time.  Continue GDMT beta-blocker, statin-no aspirin secondary to chronic anticoagulation.  3. HTN -BP mildly elevated today.  Does not check at home.  It he is hesitant to change his blood pressure regimen as it is normally well controlled.  Encouraged him to check his blood pressure 2-3 times per week and report blood pressure consistently greater than 130/80.  In the interim continue amlodipine 10 mg, atenolol 50 mg, HCTZ 25 mg.  4. Permanent atrial fibrillation -EKG today rate controlled atrial fibrillation.  He is unaware of his atrial fibrillation.  Continue rate control with atenolol and anticoagulation with Eliquis.  5. Chronic anticoagulation - Second atrial fibrillation.  CHA2DS2-VASc 5.  Denies bleeding complications.  Eliquis 5 mg twice daily.  Samples provided today.  6. HLD, LDL goal <70 - Presently on atorvastatin 80 mg and Zetia 10 mg.  Tells me he has upcoming follow-up with his primary care provider and plans to have lipid panel rechecked there.  If LDL remains greater than 70 will likely require PCSK9.  7. DM 2 - Recent A1c 6.5 by his PCP per his report.  He plans to make changes to his diet and exercise more.  He is presently on Metformin.  8. Mild MR/mild TR - Noted on echo 08/2016.  Update echocardiogram as above.  9. Anemia - Follows with GI. Recent Hb 12.5. Encouraged dietary sources of iron.   Disposition: Echocardiogram.  Follow up in September   with Dr. Fletcher Anon as previously planned. If echocardiogram is with  acute findings sooner follow-up will be scheduled.  Loel Dubonnet, NP 04/11/2019, 9:02 AM

## 2019-05-03 ENCOUNTER — Ambulatory Visit (INDEPENDENT_AMBULATORY_CARE_PROVIDER_SITE_OTHER): Payer: Medicare Other

## 2019-05-03 ENCOUNTER — Other Ambulatory Visit: Payer: Self-pay

## 2019-05-03 DIAGNOSIS — R0600 Dyspnea, unspecified: Secondary | ICD-10-CM

## 2019-05-03 DIAGNOSIS — I4821 Permanent atrial fibrillation: Secondary | ICD-10-CM

## 2019-05-03 DIAGNOSIS — R0609 Other forms of dyspnea: Secondary | ICD-10-CM

## 2019-07-25 ENCOUNTER — Telehealth: Payer: Self-pay | Admitting: Cardiovascular Disease

## 2019-07-25 NOTE — Telephone Encounter (Signed)
ADDENDUM: COMPLETE FAX NUMBER IS: [phone]

## 2019-07-25 NOTE — Telephone Encounter (Signed)
Patient with diagnosis of atrial fibrillation on Eliquis for anticoagulation.    Procedure: 4 teeth extracted  Date of procedure: 08/10/19  CHADS2-VASc score of  5 (HTN, AGE x 2, DM2, CAD)  CrCl 76.8 Platelet count 177  Per office protocol, patient can hold Eliquis for 2 days prior to procedure.

## 2019-07-25 NOTE — Telephone Encounter (Signed)
I s/w [facility] and was able to obtain complete information for dental procedure to be done. Please see update.   PROCEDURE: IS 4 TEETH TO BE EXTRACTED, DEEP CLEANING AND SCALING  DATE TO BE DONE: 08/10/19  MEDS TO BE HELD: ELIQUIS  ANESTHESIA: LOCAL  SURGEON: DR. Donia Ast

## 2019-07-25 NOTE — Telephone Encounter (Signed)
   [facility] Pre-operative Risk Assessment    Request for surgical clearance:  1. What type of surgery is being performed? DENTAL WORK  When is this surgery scheduled? TBD  2. What type of clearance is required (medical clearance vs. Pharmacy clearance to hold med vs. Both)? BOTH  3. Are there any medications that need to be held prior to surgery and how long? Chronic anitocoagulation  4. Practice name and name of physician performing surgery? [facility]  What is your office phone number [phone]  7.   What is your office fax number [phone]  8.   Anesthesia type (None, local, MAC, general) ? Not listed   Elissa Hefty 07/25/2019, 8:47 AM  _________________________________________________________________   (provider comments below)

## 2019-07-25 NOTE — Telephone Encounter (Signed)
   Primary Cardiologist: Kathlyn Sacramento, MD  Chart reviewed as part of pre-operative protocol coverage. Patient was contacted 07/25/2019 in reference to pre-operative risk assessment for pending surgery as outlined below.  Walter Flor. was last seen on 04/11/2019 by Laurann Montana, NP.  Since that day, Walter Fazzi. has done fine from a cardiac standpoint. He can easily complete 4 METs without anginal complaints.  Therefore, based on ACC/AHA guidelines, the patient would be at acceptable risk for the planned procedure without further cardiovascular testing.   Per pharmacy recommendations, patient can hold eliquis 2 days prior to his upcoming dental procedure and should restart eliquis when cleared to do so by his dentist.   I will route this recommendation to the requesting party via [place] fax function and remove from pre-op pool.  Please call with questions.  Abigail Butts, PA-C 07/25/2019, 2:41 PM

## 2020-02-04 ENCOUNTER — Ambulatory Visit: Payer: Medicare Other | Admitting: Family

## 2020-02-04 ENCOUNTER — Other Ambulatory Visit: Payer: Self-pay

## 2020-02-04 ENCOUNTER — Encounter: Payer: Self-pay | Admitting: Family

## 2020-02-04 VITALS — BP 120/60 | HR 68 | Ht 76.0 in | Wt 199.0 lb

## 2020-02-04 DIAGNOSIS — I10 Essential (primary) hypertension: Secondary | ICD-10-CM

## 2020-02-04 DIAGNOSIS — I25118 Atherosclerotic heart disease of native coronary artery with other forms of angina pectoris: Secondary | ICD-10-CM

## 2020-02-04 DIAGNOSIS — Z23 Encounter for immunization: Secondary | ICD-10-CM

## 2020-02-04 DIAGNOSIS — Z7901 Long term (current) use of anticoagulants: Secondary | ICD-10-CM | POA: Diagnosis not present

## 2020-02-04 DIAGNOSIS — I4821 Permanent atrial fibrillation: Secondary | ICD-10-CM

## 2020-02-04 DIAGNOSIS — E785 Hyperlipidemia, unspecified: Secondary | ICD-10-CM

## 2020-02-04 NOTE — Progress Notes (Signed)
Office Visit    Patient Name: Walter Mosley. Date of Encounter: 02/04/2020  Primary Care Provider:  Theotis Burrow, MD Primary Cardiologist:  Kathlyn Sacramento, MD Electrophysiologist:  None   Chief Complaint    Walter Reno Samual Beals. is a 79 y.o. male with a hx of CAD s/p remote CABG (1993), HTN, HLD, DM 2, chronic atrial fibrillation on Eliquis presents today for follow-up of CAD  Past Medical History    Past Medical History:  Diagnosis Date  . Atrial flutter ([place])    s/p ablation  . Cancer (HCC)    squamous cell carcimnoma right leg  . Chronic atrial fibrillation (HCC)    a. CHA2DS2VASc = 5-->eliquis 5 bid. (h/o epistaxis and gingival bleeding on xarelto).  . Coronary artery disease    a. 1993 s/p CABG;  b. 08/2016 Lexi MV: no ischemia/infarct->low risk. EF 55-65%.  . Diabetes mellitus without complication ([place])   . Dyspnea on exertion    a. 08/2016 Echo: EF 55-60%, no rwma, mild MR, mildly dil LA/RA, mild MR.  [name] GERD (gastroesophageal reflux disease)   . Hyperlipidemia    Mixed  . Hypertension    Unspecified  . 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 Osteoarthritis, knee   . PAT (paroxysmal atrial tachycardia) ([place])   . PSVT (paroxysmal supraventricular tachycardia) ([place])    Past Surgical History:  Procedure Laterality Date  . CARDIAC CATHETERIZATION  1993  . CARDIAC ELECTROPHYSIOLOGY STUDY AND ABLATION    . CARDIAC SURGERY     by pass  . COLONOSCOPY WITH PROPOFOL N/A 10/14/2016   Procedure: COLONOSCOPY WITH PROPOFOL;  Surgeon: Manya Silvas, MD;  Location: [facility] ENDOSCOPY;  Service: Endoscopy;  Laterality: N/A;  . COLONOSCOPY WITH PROPOFOL N/A 10/03/2017   Procedure: COLONOSCOPY WITH PROPOFOL;  Surgeon: [name], MD;  Location: [place];  Service: Endoscopy;  Laterality: N/A;  Diabetic - oral meds  . CORONARY ARTERY BYPASS GRAFT  1993   CABG x 5 @ [place]  . ESOPHAGOGASTRODUODENOSCOPY (EGD) WITH PROPOFOL N/A 10/14/2016    Procedure: ESOPHAGOGASTRODUODENOSCOPY (EGD) WITH PROPOFOL;  Surgeon: Manya Silvas, MD;  Location: [facility] ENDOSCOPY;  Service: Endoscopy;  Laterality: N/A;  . HEMORRHOID SURGERY    . MOHS SURGERY    . POLYPECTOMY N/A 10/03/2017   Procedure: POLYPECTOMY;  Surgeon: [name], MD;  Location: [place];  Service: Endoscopy;  Laterality: N/A;    Allergies  Allergies  Allergen Reactions  . Xarelto [Rivaroxaban]     Other reaction(s): Bleeding gums    History of Present Illness    Walter Mosley. is a 79 y.o. male with a hx of CAD s/p remote CABG (1993), HTN, HLD, DM 2, chronic atrial fibrillation on Eliquis last seen 04/11/2019.  Atrial flutter s/p RF CVA in 2010.  Last echo 08/2016 normal LVEF, mild MR, mild TR. Stress testing at that time was nonischemic.  Of note he had previous bleeding issues with Xarelto but has had none with Eliquis.  He presented to [facility] urgent care 04/04/2019 with increased dyspnea on exertion. EKG atrial fibrillation 80 bpm with nonspecific T wave inversion in inferior leads. chest x-ray with no acute findings.  CBC, BMP, hs-TnI all normal.  Seen 04/11/2019 after urgent care visit.  Tells me shortness of breath had resolved.  He attributed his shortness of breath to a cold at the time.  He was recommended for echocardiogram.  Echo 05/03/2019 with LVEF 55 to 60%, no  RWMA, LA moderately dilated, mildly elevated PASP.  Reports intermittent fluttering sensation in his chest for 2-3 days but it would go away when he rubbed it.  Tells me it was overall not bothersome but wanted to be sure nothing to worry about.  No chest pain, pressure, tightness.  No shortness of breath at rest nor dyspnea on exertion.  Reports pain behind his left knee that he notices only at night.  Reports no leg pain when walking.  Tells me his leg feels better when he is sleeping at night when it is extended.  We reviewed symptoms of peripheral arterial disease in his symptoms are not  consistent.  Going to see his sister in [place] in a few weeks.  He will be driving with his nephew and his nephew's RV and he is excited about the trip.  EKGs/Labs/Other Studies Reviewed:   The following studies were reviewed today: Echo 04/2019   1. Left ventricular ejection fraction, by visual estimation, is 55 to  60%. The left ventricle has normal function. There is no left ventricular  hypertrophy.   2. Left ventricular diastolic parameters are indeterminate.   3. The left ventricle has no regional wall motion abnormalities.   4. Global right ventricle has normal systolic function.The right  ventricular size is normal. No increase in right ventricular wall  thickness.   5. Left atrial size was moderately dilated.   6. Mildly elevated pulmonary artery systolic pressure.   7. The inferior vena cava is dilated in size with >50% respiratory  variability, suggesting right atrial pressure of 8 mmHg.   Echo 08/2016 - Left ventricle: The cavity size was normal. There was mild   concentric hypertrophy. Systolic function was normal. The   estimated ejection fraction was in the range of 55% to 60%. Wall   motion was normal; there were no regional wall motion   abnormalities. The study is not technically sufficient to allow   evaluation of LV diastolic function. - Mitral valve: There was mild regurgitation. - Left atrium: The atrium was mildly dilated. - Right atrium: The atrium was mildly dilated. - Tricuspid valve: There was mild regurgitation. - Pulmonary arteries: Systolic pressure was within the normal   range.  EKG:  EKG is ordered today.  The ekg ordered today demonstrates atrial fibrillation 70 bpm  Recent Labs: 04/04/2019: BUN 13; Creatinine, Ser 1.04; Hemoglobin 12.5; Platelets 177; Potassium 3.6; Sodium 136  Recent Lipid Panel    Component Value Date/Time   CHOL 134 05/27/2016 0806   TRIG 116 05/27/2016 0806   HDL 32 (L) 05/27/2016 0806   CHOLHDL 4.2 05/27/2016 0806    CHOLHDL 6.0 CALC 06/24/2008 1202   VLDL 30 06/24/2008 1202   LDLCALC 79 05/27/2016 0806   Home Medications   Current Meds  Medication Sig  . amLODipine (NORVASC) 10 MG tablet Take 10 mg by mouth daily.   [name] apixaban (ELIQUIS) 5 MG TABS tablet Take 5 mg by mouth 2 (two) times daily.  [name] atenolol (TENORMIN) 50 MG tablet Take 50 mg by mouth daily.   [name] atorvastatin (LIPITOR) 80 MG tablet Take 80 mg by mouth daily.   [name] ezetimibe (ZETIA) 10 MG tablet Take 10 mg by mouth daily.  . hydrochlorothiazide (HYDRODIURIL) 25 MG tablet Take 25 mg by mouth daily.   . metFORMIN (GLUCOPHAGE) 500 MG tablet Take 500 mg by mouth 2 (two) times daily with a meal.   . potassium chloride (K-DUR,KLOR-CON) 10 MEQ tablet Take 10 mEq by mouth  2 (two) times daily.    Review of Systems   All other systems reviewed and are otherwise negative except as noted above.  Physical Exam    VS:  BP 120/60 (BP Location: Left Arm, Patient Position: Sitting, Cuff Size: Normal)   Pulse 68   Ht 6\' 4"  (1.93 m)   Wt 199 lb (90.3 kg)   SpO2 96%   BMI 24.22 kg/m  , BMI Body mass index is 24.22 kg/m. GEN: Well nourished, well developed, in no acute distress. HEENT: normal. Neck: Supple, no JVD, carotid bruits, or masses. Cardiac: irregularly irregular, no murmurs, rubs, or gallops. No clubbing, cyanosis, edema.  Radials/DP/PT 2+ and equal bilaterally.  Respiratory:  Respirations regular and unlabored, clear to auscultation bilaterally. GI: Soft, nontender, nondistended. MS: No deformity or atrophy. Skin: Warm and dry, no rash. Neuro:  Strength and sensation are intact. Psych: Normal affect.  Assessment & Plan    1. CAD s/p remote CABG at [facility] in [place] test 08/2016 and nonischemic with normal LVEF by echo at that time.  Echo 04/2019 with no wall motion abnormalities.  No anginal symptoms.  No indication for ischemic evaluation at this time.  GDMT includes atenolol, ezetimibe, atorvastatin.  2. HTN - BP well  controlled.  Has improved after approximately 10 pound weight loss.  Congratulated on weight loss.  Continue current antihypertensive regimen including atenolol 50 mg daily, hydrochlorothiazide 25 mg daily, amlodipine 10 mg daily.  3. Permanent atrial fibrillation -EKG today rate controlled atrial fibrillation.He is unaware of his atrial fibrillation.  Continue rate control with atenolol and anticoagulation with Eliquis.  He had a few day history of palpitations that were not painful, not bothersome.  Reassurance provided.  As he is appropriately anticoagulated and symptoms are not bothersome, no indication for ischemic ZIO monitor.   4. Chronic anticoagulation - Secondary to atrial fibrillation.  CHA2DS2-VASc 5.  Denies bleeding complications.  Eliquis 5 mg twice daily.  5. HLD, LDL goal <70 - Presently on atorvastatin 80 mg and Zetia 10 mg. Reports recent lipid panel with PCP though unavailable for my review. If LDL remains greater than 70 will likely require PCSK9.  6. DM 2 - Continue to follow with PCP.   7. Need for immunization - Received flu vaccine in clinic today.   Disposition:  Follow up in 6 month(s) with Dr. Fletcher Anon or APP  Loel Dubonnet, NP 02/04/2020, 1:46 PM

## 2020-02-04 NOTE — Patient Instructions (Addendum)
Medication Instructions:  No medication changes today.    *If you need a refill on your cardiac medications before your next appointment, please call your pharmacy*  Lab Work: No lab work today.   Testing/Procedures: Your EKG today shows rate controlled atrial fibrillation which is a stable finding.   Follow-Up: At [facility], you and your health needs are our priority.  As part of our continuing mission to provide you with exceptional heart care, we have created designated Provider Care Teams.  These Care Teams include your primary Cardiologist (physician) and Advanced Practice Providers (APPs -  Physician Assistants and Nurse Practitioners) who all work together to provide you with the care you need, when you need it.  We recommend signing up for the patient portal called "MyChart".  Sign up information is provided on this After Visit Summary.  MyChart is used to connect with patients for Virtual Visits (Telemedicine).  Patients are able to view lab/test results, encounter notes, upcoming appointments, etc.  Non-urgent messages can be sent to your provider as well.   To learn more about what you can do with MyChart, go to [email or web address].    Your next appointment:   6 month(s)  The format for your next appointment:   In Person  Provider:   You may see Kathlyn Sacramento, MD or one of the following Advanced Practice Providers on your designated Care Team:    Murray Hodgkins, NP  Christell Faith, PA-C  Marrianne Mood, PA-C  Cadence Maywood, [place]  Laurann Montana, NP

## 2020-12-02 ENCOUNTER — Encounter: Payer: Self-pay | Admitting: Ophthalmology

## 2020-12-16 ENCOUNTER — Encounter
Admission: RE | Disposition: A | Discharge status: Home / Self Care | Payer: Self-pay | Source: Home / Self Care | Attending: Ophthalmology

## 2020-12-16 ENCOUNTER — Ambulatory Visit: Payer: Medicare HMO | Admitting: Anesthesiology

## 2020-12-16 ENCOUNTER — Other Ambulatory Visit: Payer: Self-pay

## 2020-12-16 ENCOUNTER — Ambulatory Visit
Admission: RE | Admit: 2020-12-16 | Discharge: 2020-12-16 | Disposition: A | Discharge status: Home / Self Care | Payer: Medicare HMO | Attending: Ophthalmology | Admitting: Ophthalmology

## 2020-12-16 DIAGNOSIS — Z85828 Personal history of other malignant neoplasm of skin: Secondary | ICD-10-CM | POA: Diagnosis not present

## 2020-12-16 DIAGNOSIS — Z7984 Long term (current) use of oral hypoglycemic drugs: Secondary | ICD-10-CM | POA: Insufficient documentation

## 2020-12-16 DIAGNOSIS — Z951 Presence of aortocoronary bypass graft: Secondary | ICD-10-CM | POA: Insufficient documentation

## 2020-12-16 DIAGNOSIS — E1136 Type 2 diabetes mellitus with diabetic cataract: Secondary | ICD-10-CM | POA: Diagnosis present

## 2020-12-16 DIAGNOSIS — H2512 Age-related nuclear cataract, left eye: Secondary | ICD-10-CM | POA: Insufficient documentation

## 2020-12-16 DIAGNOSIS — Z888 Allergy status to other drugs, medicaments and biological substances status: Secondary | ICD-10-CM | POA: Insufficient documentation

## 2020-12-16 DIAGNOSIS — Z7901 Long term (current) use of anticoagulants: Secondary | ICD-10-CM | POA: Insufficient documentation

## 2020-12-16 DIAGNOSIS — Z79899 Other long term (current) drug therapy: Secondary | ICD-10-CM | POA: Insufficient documentation

## 2020-12-16 HISTORY — DX: Cardiac arrhythmia, unspecified: I49.9

## 2020-12-16 HISTORY — PX: CATARACT EXTRACTION W/PHACO: SHX586

## 2020-12-16 LAB — GLUCOSE, CAPILLARY: Glucose-Capillary: 144 mg/dL — ABNORMAL HIGH (ref 70–99)

## 2020-12-16 SURGERY — PHACOEMULSIFICATION, CATARACT, WITH IOL INSERTION
Anesthesia: Monitor Anesthesia Care | Site: Eye | Laterality: Left

## 2020-12-16 MED ORDER — MOXIFLOXACIN HCL 0.5 % OP SOLN
OPHTHALMIC | Status: DC | PRN
  Administered 2020-12-16: 0.2 mL via OPHTHALMIC

## 2020-12-16 MED ORDER — LACTATED RINGERS IV SOLN: INTRAVENOUS | Status: DC

## 2020-12-16 MED ORDER — CYCLOPENTOLATE HCL 2 % OP SOLN
1.0000 [drp] | OPHTHALMIC | Status: AC
  Administered 2020-12-16 (×3): 1 [drp] via OPHTHALMIC

## 2020-12-16 MED ORDER — MIDAZOLAM HCL 2 MG/2ML IJ SOLN
INTRAMUSCULAR | Status: DC | PRN
  Administered 2020-12-16: 1 mg via INTRAVENOUS

## 2020-12-16 MED ORDER — SIGHTPATH DOSE#1 NA CHONDROIT SULF-NA HYALURON 40-17 MG/ML IO SOLN
INTRAOCULAR | Status: DC | PRN
  Administered 2020-12-16: 1 mL via INTRAOCULAR

## 2020-12-16 MED ORDER — BRIMONIDINE TARTRATE-TIMOLOL 0.2-0.5 % OP SOLN
OPHTHALMIC | Status: DC | PRN
  Administered 2020-12-16: 1 [drp] via OPHTHALMIC

## 2020-12-16 MED ORDER — SIGHTPATH DOSE#1 BSS IO SOLN
INTRAOCULAR | Status: DC | PRN
  Administered 2020-12-16: 15 mL

## 2020-12-16 MED ORDER — PHENYLEPHRINE HCL 10 % OP SOLN
1.0000 [drp] | OPHTHALMIC | Status: AC
  Administered 2020-12-16 (×3): 1 [drp] via OPHTHALMIC

## 2020-12-16 MED ORDER — SIGHTPATH DOSE#1 BSS IO SOLN
INTRAOCULAR | Status: DC | PRN
  Administered 2020-12-16: 83 mL via OPHTHALMIC

## 2020-12-16 MED ORDER — SIGHTPATH DOSE#1 BSS IO SOLN
INTRAOCULAR | Status: DC | PRN
  Administered 2020-12-16: 1 mL

## 2020-12-16 MED ORDER — TETRACAINE HCL 0.5 % OP SOLN
1.0000 [drp] | OPHTHALMIC | Status: DC | PRN
  Administered 2020-12-16 (×3): 1 [drp] via OPHTHALMIC

## 2020-12-16 MED ORDER — FENTANYL CITRATE (PF) 100 MCG/2ML IJ SOLN
INTRAMUSCULAR | Status: DC | PRN
  Administered 2020-12-16: 25 ug via INTRAVENOUS
  Administered 2020-12-16: 50 ug via INTRAVENOUS

## 2020-12-16 SURGICAL SUPPLY — 16 items
CANNULA ANT/CHMB 27GA (MISCELLANEOUS) ×4 IMPLANT
GLOVE SURG ENC TEXT LTX SZ8 (GLOVE) ×2 IMPLANT
GLOVE SURG TRIUMPH 8.0 PF LTX (GLOVE) ×2 IMPLANT
GOWN STRL REUS W/ TWL LRG LVL3 (GOWN DISPOSABLE) ×2 IMPLANT
GOWN STRL REUS W/TWL LRG LVL3 (GOWN DISPOSABLE) ×4
LENS IOL TECNIS EYHANCE 21.5 (Intraocular Lens) ×2 IMPLANT
MARKER SKIN DUAL TIP RULER LAB (MISCELLANEOUS) ×2 IMPLANT
NEEDLE FILTER BLUNT 18X 1/2SAF (NEEDLE) ×1
NEEDLE FILTER BLUNT 18X1 1/2 (NEEDLE) ×1 IMPLANT
PACK EYE AFTER SURG (MISCELLANEOUS) ×2 IMPLANT
SUT ETHILON 10-0 CS-B-6CS-B-6 (SUTURE)
SUTURE EHLN 10-0 CS-B-6CS-B-6 (SUTURE) IMPLANT
SYR 3ML LL SCALE MARK (SYRINGE) ×2 IMPLANT
SYR TB 1ML LUER SLIP (SYRINGE) ×2 IMPLANT
WATER STERILE IRR 250ML POUR (IV SOLUTION) ×2 IMPLANT
WIPE NON LINTING 3.25X3.25 (MISCELLANEOUS) ×2 IMPLANT

## 2020-12-16 NOTE — Anesthesia Postprocedure Evaluation (Signed)
Anesthesia Post Note  Patient: Walter Mosley.  Procedure(s) Performed: CATARACT EXTRACTION PHACO AND INTRAOCULAR LENS PLACEMENT (IOC) LEFT (Left: Eye)     Patient location during evaluation: PACU Anesthesia Type: MAC Level of consciousness: awake and alert Pain management: pain level controlled Vital Signs Assessment: post-procedure vital signs reviewed and stable Respiratory status: spontaneous breathing Cardiovascular status: stable Anesthetic complications: no   No notable events documented.  Gillian Scarce

## 2020-12-16 NOTE — Discharge Instructions (Signed)

## 2020-12-16 NOTE — Anesthesia Preprocedure Evaluation (Addendum)
Anesthesia Evaluation  Patient identified by MRN, date of birth, ID band Patient awake    Reviewed: Allergy & Precautions, H&P , NPO status , Patient's Chart, lab work & pertinent test results  Airway Mallampati: II  TM Distance: >3 FB Neck ROM: full    Dental no notable dental hx.    Pulmonary neg pulmonary ROS,    Pulmonary exam normal        Cardiovascular hypertension, + CAD  Normal cardiovascular exam+ dysrhythmias Atrial Fibrillation  Rhythm:irregular Rate:Normal     Neuro/Psych negative neurological ROS  negative psych ROS   GI/Hepatic Neg liver ROS, Medicated,  Endo/Other  diabetes, Well Controlled, Type 2  Renal/GU negative Renal ROS  negative genitourinary   Musculoskeletal   Abdominal   Peds  Hematology negative hematology ROS (+)   Anesthesia Other Findings   Reproductive/Obstetrics                            Anesthesia Physical Anesthesia Plan  ASA: 3  Anesthesia Plan: MAC   Post-op Pain Management:    Induction:   PONV Risk Score and Plan: 1 and Midazolam and TIVA  Airway Management Planned:   Additional Equipment:   Intra-op Plan:   Post-operative Plan:   Informed Consent: I have reviewed the patients History and Physical, chart, labs and discussed the procedure including the risks, benefits and alternatives for the proposed anesthesia with the patient or authorized representative who has indicated his/her understanding and acceptance.       Plan Discussed with:   Anesthesia Plan Comments:         Anesthesia Quick Evaluation

## 2020-12-16 NOTE — Transfer of Care (Signed)
Immediate Anesthesia Transfer of Care Note  Patient: Walter Mosley.  Procedure(s) Performed: CATARACT EXTRACTION PHACO AND INTRAOCULAR LENS PLACEMENT (IOC) LEFT (Left: Eye)  Patient Location: PACU  Anesthesia Type: MAC  Level of Consciousness: awake, alert  and patient cooperative  Airway and Oxygen Therapy: Patient Spontanous Breathing and Patient connected to supplemental oxygen  Post-op Assessment: Post-op Vital signs reviewed, Patient's Cardiovascular Status Stable, Respiratory Function Stable, Patent Airway and No signs of Nausea or vomiting  Post-op Vital Signs: Reviewed and stable  Complications: No notable events documented.

## 2020-12-16 NOTE — H&P (Signed)
[facility]   Primary Care Physician:  Alene Mires Elyse Jarvis, MD Ophthalmologist: Dr. Benay Pillow  Pre-Procedure History & Physical: HPI:  Walter Muzio. is a 80 y.o. male here for cataract surgery.   Past Medical History:  Diagnosis Date   Atrial flutter ([place])    s/p ablation   Cancer ([place])    squamous cell carcimnoma right leg   Chronic atrial fibrillation (HCC)    a. CHA2DS2VASc = 5-->eliquis 5 bid. (h/o epistaxis and gingival bleeding on xarelto).   Coronary artery disease    a. 1993 s/p CABG;  b. 08/2016 Lexi MV: no ischemia/infarct->low risk. EF 55-65%.   Diabetes mellitus without complication ([place])    Dyspnea on exertion    a. 08/2016 Echo: EF 55-60%, no rwma, mild MR, mildly dil LA/RA, mild MR.   Dysrhythmia    GERD (gastroesophageal reflux disease)    Hyperlipidemia    Mixed   Hypertension    Unspecified   MRSA (methicillin resistant staph aureus) culture positive    Osteoarthritis, knee    PAT (paroxysmal atrial tachycardia) (HCC)    PSVT (paroxysmal supraventricular tachycardia) ([place])     Past Surgical History:  Procedure Laterality Date   [place]     by pass   COLONOSCOPY WITH PROPOFOL N/A 10/14/2016   Procedure: COLONOSCOPY WITH PROPOFOL;  Surgeon: Manya Silvas, MD;  Location: [facility] ENDOSCOPY;  Service: Endoscopy;  Laterality: N/A;   COLONOSCOPY WITH PROPOFOL N/A 10/03/2017   Procedure: COLONOSCOPY WITH PROPOFOL;  Surgeon: [name], MD;  Location: [place];  Service: Endoscopy;  Laterality: N/A;  Diabetic - oral meds   CORONARY ARTERY BYPASS GRAFT  1993   CABG x 5 @ [facility]   ESOPHAGOGASTRODUODENOSCOPY (EGD) WITH PROPOFOL N/A 10/14/2016   Procedure: ESOPHAGOGASTRODUODENOSCOPY (EGD) WITH PROPOFOL;  Surgeon: Manya Silvas, MD;  Location: [facility] ENDOSCOPY;  Service: Endoscopy;  Laterality: N/A;   HEMORRHOID SURGERY     MOHS SURGERY     POLYPECTOMY  N/A 10/03/2017   Procedure: POLYPECTOMY;  Surgeon: [name], MD;  Location: [place];  Service: Endoscopy;  Laterality: N/A;    Prior to Admission medications   Medication Sig Start Date End Date Taking? Authorizing Provider  amLODipine (NORVASC) 10 MG tablet Take 10 mg by mouth daily.    Yes [provider]  apixaban (ELIQUIS) 5 MG TABS tablet Take 5 mg by mouth 2 (two) times daily.   Yes [provider]  atenolol (TENORMIN) 50 MG tablet Take 50 mg by mouth daily.    Yes [provider]  atorvastatin (LIPITOR) 80 MG tablet Take 80 mg by mouth daily.    Yes [provider]  ezetimibe (ZETIA) 10 MG tablet Take 10 mg by mouth daily.   Yes [provider]  hydrochlorothiazide (HYDRODIURIL) 25 MG tablet Take 25 mg by mouth daily.    Yes [provider]  metFORMIN (GLUCOPHAGE) 500 MG tablet Take 500 mg by mouth 2 (two) times daily with a meal.    Yes [provider]  Multiple Vitamins-Minerals (CENTRUM SILVER PO) Take by mouth daily.   Yes [provider]  potassium chloride (K-DUR,KLOR-CON) 10 MEQ tablet Take 10 mEq by mouth 2 (two) times daily.   Yes [provider]    Allergies as of 11/20/2020 - Review Complete 02/04/2020  Allergen Reaction Noted   Xarelto [rivaroxaban]  02/26/2013  Family History  Problem Relation Age of Onset   Heart disease Father    Heart attack Father 46       MI   Coronary artery disease Other     Social History   Socioeconomic History   Marital status: Married    Spouse name: Not on file   Number of children: Not on file   Years of education: Not on file   Highest education level: Not on file  Occupational History   Occupation: Retired  Tobacco Use   Smoking status: Never   Smokeless tobacco: Never  Vaping Use   Vaping Use: Never used  Substance and Sexual Activity   Alcohol use: Yes    Comment: may have 1 beer/month   Drug use: No   Sexual activity:  Not on file  Other Topics Concern   Not on file  Social History Narrative   Gets regular exercise.   Social Determinants of Health   Financial Resource Strain: Not on file  Food Insecurity: Not on file  Transportation Needs: Not on file  Physical Activity: Not on file  Stress: Not on file  Social Connections: Not on file  Intimate Partner Violence: Not on file    Review of Systems: See HPI, otherwise negative ROS  Physical Exam: BP (!) 155/84   Pulse 88   Temp 97.9 F (36.6 C) (Temporal)   Ht '6\' 4"'$  (1.93 m)   Wt 90.8 kg   SpO2 96%   BMI 24.37 kg/m  General:   Alert, cooperative in NAD Head:  Normocephalic and atraumatic. Respiratory:  Normal work of breathing. Cardiovascular:  RRR  Impression/Plan: Walter Flor. is here for cataract surgery.  Risks, benefits, limitations, and alternatives regarding cataract surgery have been reviewed with the patient.  Questions have been answered.  All parties agreeable.   Walter Robson, MD  12/16/2020, 11:32 AM

## 2020-12-16 NOTE — Op Note (Signed)
PREOPERATIVE DIAGNOSIS:  Nuclear sclerotic cataract of the left eye.   POSTOPERATIVE DIAGNOSIS:  Nuclear sclerotic cataract of the left eye.   OPERATIVE PROCEDURE:ORPROCALL@   SURGEON:  Birder Robson, MD.   ANESTHESIA:  Anesthesiologist: Elgie Collard, MD CRNA: Dionne Bucy, CRNA  1.      Managed anesthesia care. 2.     0.28m of Shugarcaine was instilled following the paracentesis   COMPLICATIONS:  None.   TECHNIQUE:   Stop and chop   DESCRIPTION OF PROCEDURE:  The patient was examined and consented in the preoperative holding area where the aforementioned topical anesthesia was applied to the left eye and then brought back to the Operating Room where the left eye was prepped and draped in the usual sterile ophthalmic fashion and a lid speculum was placed. A paracentesis was created with the side port blade and the anterior chamber was filled with viscoelastic. A near clear corneal incision was performed with the steel keratome. A continuous curvilinear capsulorrhexis was performed with a cystotome followed by the capsulorrhexis forceps. Hydrodissection and hydrodelineation were carried out with BSS on a blunt cannula. The lens was removed in a stop and chop  technique and the remaining cortical material was removed with the irrigation-aspiration handpiece. The capsular bag was inflated with viscoelastic and the Technis [id number] lens was placed in the capsular bag without complication. The remaining viscoelastic was removed from the eye with the irrigation-aspiration handpiece. The wounds were hydrated. The anterior chamber was flushed with BSS and the eye was inflated to physiologic pressure. 0.173mVigamox was placed in the anterior chamber. The wounds were found to be water tight. The eye was dressed with Combigan. The patient was given protective glasses to wear throughout the day and a shield with which to sleep tonight. The patient was also given drops with which to begin a drop regimen  today and will follow-up with me in one day. Implant Name Type Inv. Item Serial No. Manufacturer Lot No. LRB No. Used Action  LENS IOL TECNIS EYHANCE 21.5 - [id number] Lens LENS IOL TECNIS EYHANCE 21.5 [id number]   Left 1 Implanted    Procedure(s) with comments: CATARACT EXTRACTION PHACO AND INTRAOCULAR LENS PLACEMENT (IOC) LEFT (Left) - 13.65 1:05.0  Electronically signed: WiBirder Robson/09/2020 12:03 PM

## 2020-12-16 NOTE — Anesthesia Procedure Notes (Signed)
Procedure Name: MAC Date/Time: 12/16/2020 11:41 AM Performed by: Dionne Bucy, CRNA Pre-anesthesia Checklist: Patient identified, Emergency Drugs available, Suction available, Patient being monitored and Timeout performed Patient Re-evaluated:Patient Re-evaluated prior to induction Oxygen Delivery Method: Nasal cannula Placement Confirmation: positive ETCO2

## 2020-12-17 ENCOUNTER — Encounter: Payer: Self-pay | Admitting: Ophthalmology

## 2020-12-29 NOTE — Discharge Instructions (Signed)

## 2020-12-30 ENCOUNTER — Ambulatory Visit
Admission: RE | Admit: 2020-12-30 | Discharge: 2020-12-30 | Disposition: A | Discharge status: Home / Self Care | Payer: Medicare HMO | Attending: Ophthalmology | Admitting: Ophthalmology

## 2020-12-30 ENCOUNTER — Ambulatory Visit: Payer: Medicare HMO | Admitting: Anesthesiology

## 2020-12-30 ENCOUNTER — Encounter
Admission: RE | Disposition: A | Discharge status: Home / Self Care | Payer: Self-pay | Source: Home / Self Care | Attending: Ophthalmology

## 2020-12-30 ENCOUNTER — Other Ambulatory Visit: Payer: Self-pay

## 2020-12-30 DIAGNOSIS — Z7901 Long term (current) use of anticoagulants: Secondary | ICD-10-CM | POA: Diagnosis not present

## 2020-12-30 DIAGNOSIS — H2511 Age-related nuclear cataract, right eye: Secondary | ICD-10-CM | POA: Insufficient documentation

## 2020-12-30 DIAGNOSIS — Z79899 Other long term (current) drug therapy: Secondary | ICD-10-CM | POA: Insufficient documentation

## 2020-12-30 DIAGNOSIS — Z888 Allergy status to other drugs, medicaments and biological substances status: Secondary | ICD-10-CM | POA: Insufficient documentation

## 2020-12-30 DIAGNOSIS — Z8614 Personal history of Methicillin resistant Staphylococcus aureus infection: Secondary | ICD-10-CM | POA: Diagnosis not present

## 2020-12-30 DIAGNOSIS — Z7984 Long term (current) use of oral hypoglycemic drugs: Secondary | ICD-10-CM | POA: Diagnosis not present

## 2020-12-30 DIAGNOSIS — Z951 Presence of aortocoronary bypass graft: Secondary | ICD-10-CM | POA: Diagnosis not present

## 2020-12-30 HISTORY — PX: CATARACT EXTRACTION W/PHACO: SHX586

## 2020-12-30 LAB — GLUCOSE, CAPILLARY: Glucose-Capillary: 133 mg/dL — ABNORMAL HIGH (ref 70–99)

## 2020-12-30 SURGERY — PHACOEMULSIFICATION, CATARACT, WITH IOL INSERTION
Anesthesia: Monitor Anesthesia Care | Site: Eye | Laterality: Right

## 2020-12-30 MED ORDER — ACETAMINOPHEN 160 MG/5ML PO SOLN
325.0000 mg | Freq: Once | ORAL | Status: DC
Start: 2020-12-30 — End: 2020-12-30

## 2020-12-30 MED ORDER — SIGHTPATH DOSE#1 NA CHONDROIT SULF-NA HYALURON 40-17 MG/ML IO SOLN
INTRAOCULAR | Status: DC | PRN
  Administered 2020-12-30: 1 mL via INTRAOCULAR

## 2020-12-30 MED ORDER — SIGHTPATH DOSE#1 BSS IO SOLN
INTRAOCULAR | Status: DC | PRN
  Administered 2020-12-30: 15 mL

## 2020-12-30 MED ORDER — TETRACAINE HCL 0.5 % OP SOLN
1.0000 [drp] | OPHTHALMIC | Status: DC | PRN
  Administered 2020-12-30 (×3): 1 [drp] via OPHTHALMIC

## 2020-12-30 MED ORDER — SIGHTPATH DOSE#1 BSS IO SOLN
INTRAOCULAR | Status: DC | PRN
  Administered 2020-12-30: 56 mL via OPHTHALMIC

## 2020-12-30 MED ORDER — SIGHTPATH DOSE#1 BSS IO SOLN
INTRAOCULAR | Status: DC | PRN
  Administered 2020-12-30: 1 mL via INTRAMUSCULAR

## 2020-12-30 MED ORDER — FENTANYL CITRATE (PF) 100 MCG/2ML IJ SOLN
INTRAMUSCULAR | Status: DC | PRN
  Administered 2020-12-30: 50 ug via INTRAVENOUS

## 2020-12-30 MED ORDER — BRIMONIDINE TARTRATE-TIMOLOL 0.2-0.5 % OP SOLN
OPHTHALMIC | Status: DC | PRN
  Administered 2020-12-30: 1 [drp] via OPHTHALMIC

## 2020-12-30 MED ORDER — MOXIFLOXACIN HCL 0.5 % OP SOLN
OPHTHALMIC | Status: DC | PRN
  Administered 2020-12-30: 0.2 mL via OPHTHALMIC

## 2020-12-30 MED ORDER — LACTATED RINGERS IV SOLN: INTRAVENOUS | Status: DC

## 2020-12-30 MED ORDER — MIDAZOLAM HCL 2 MG/2ML IJ SOLN
INTRAMUSCULAR | Status: DC | PRN
  Administered 2020-12-30: 2 mg via INTRAVENOUS

## 2020-12-30 MED ORDER — ACETAMINOPHEN 325 MG PO TABS: 325.0000 mg | ORAL_TABLET | Freq: Once | ORAL | Status: DC

## 2020-12-30 MED ORDER — ARMC OPHTHALMIC DILATING DROPS
1.0000 "application " | OPHTHALMIC | Status: DC | PRN
  Administered 2020-12-30 (×3): 1 via OPHTHALMIC

## 2020-12-30 SURGICAL SUPPLY — 14 items
CANNULA ANT/CHMB 27GA (MISCELLANEOUS) ×4 IMPLANT
GLOVE SURG ENC TEXT LTX SZ8 (GLOVE) ×2 IMPLANT
GLOVE SURG TRIUMPH 8.0 PF LTX (GLOVE) ×2 IMPLANT
GOWN STRL REUS W/ TWL LRG LVL3 (GOWN DISPOSABLE) ×2 IMPLANT
GOWN STRL REUS W/TWL LRG LVL3 (GOWN DISPOSABLE) ×4
LENS IOL TECNIS EYHANCE 22.0 (Intraocular Lens) ×2 IMPLANT
MARKER SKIN DUAL TIP RULER LAB (MISCELLANEOUS) ×2 IMPLANT
NEEDLE FILTER BLUNT 18X 1/2SAF (NEEDLE) ×1
NEEDLE FILTER BLUNT 18X1 1/2 (NEEDLE) ×1 IMPLANT
PACK EYE AFTER SURG (MISCELLANEOUS) ×2 IMPLANT
SYR 3ML LL SCALE MARK (SYRINGE) ×2 IMPLANT
SYR TB 1ML LUER SLIP (SYRINGE) ×2 IMPLANT
WATER STERILE IRR 250ML POUR (IV SOLUTION) ×2 IMPLANT
WIPE NON LINTING 3.25X3.25 (MISCELLANEOUS) ×2 IMPLANT

## 2020-12-30 NOTE — Anesthesia Preprocedure Evaluation (Signed)
Anesthesia Evaluation  Patient identified by MRN, date of birth, ID band Patient awake    Reviewed: Allergy & Precautions, H&P , NPO status , Patient's Chart, lab work & pertinent test results  Airway Mallampati: II  TM Distance: >3 FB Neck ROM: full    Dental  (+) Chipped, Poor Dentition   Pulmonary neg pulmonary ROS,    Pulmonary exam normal breath sounds clear to auscultation       Cardiovascular hypertension, + CAD  Normal cardiovascular exam+ dysrhythmias  Rhythm:regular Rate:Normal     Neuro/Psych negative neurological ROS  negative psych ROS   GI/Hepatic Neg liver ROS,   Endo/Other  diabetes, Well Controlled, Type 2  Renal/GU negative Renal ROS  negative genitourinary   Musculoskeletal   Abdominal   Peds  Hematology negative hematology ROS (+)   Anesthesia Other Findings   Reproductive/Obstetrics                             Anesthesia Physical  Anesthesia Plan  ASA: 3  Anesthesia Plan: MAC   Post-op Pain Management:    Induction:   PONV Risk Score and Plan: 1 and Treatment may vary due to age or medical condition, TIVA and Midazolam  Airway Management Planned:   Additional Equipment:   Intra-op Plan:   Post-operative Plan:   Informed Consent: I have reviewed the patients History and Physical, chart, labs and discussed the procedure including the risks, benefits and alternatives for the proposed anesthesia with the patient or authorized representative who has indicated his/her understanding and acceptance.     Dental Advisory Given  Plan Discussed with: CRNA  Anesthesia Plan Comments:         Anesthesia Quick Evaluation

## 2020-12-30 NOTE — Anesthesia Postprocedure Evaluation (Signed)
Anesthesia Post Note  Patient: Walter Mosley.  Procedure(s) Performed: CATARACT EXTRACTION PHACO AND INTRAOCULAR LENS PLACEMENT (IOC) RIGHT 10.10 00:54.8 (Right: Eye)     Patient location during evaluation: PACU Anesthesia Type: MAC Level of consciousness: awake and alert and oriented Pain management: satisfactory to patient Vital Signs Assessment: post-procedure vital signs reviewed and stable Respiratory status: spontaneous breathing, nonlabored ventilation and respiratory function stable Cardiovascular status: blood pressure returned to baseline and stable Postop Assessment: Adequate PO intake and No signs of nausea or vomiting Anesthetic complications: no   No notable events documented.  Raliegh Ip

## 2020-12-30 NOTE — Anesthesia Procedure Notes (Signed)
Procedure Name: MAC Date/Time: 12/30/2020 10:45 AM Performed by: Jeannene Patella, CRNA Pre-anesthesia Checklist: Patient identified, Emergency Drugs available, Suction available, Timeout performed and Patient being monitored Patient Re-evaluated:Patient Re-evaluated prior to induction Oxygen Delivery Method: Nasal cannula Placement Confirmation: positive ETCO2

## 2020-12-30 NOTE — Op Note (Signed)
PREOPERATIVE DIAGNOSIS:  Nuclear sclerotic cataract of the right eye.   POSTOPERATIVE DIAGNOSIS:  H25.11 Cataract   OPERATIVE PROCEDURE:ORPROCALL@   SURGEON:  Walter Robson, MD.   ANESTHESIA:  Anesthesiologist: Ronelle Nigh, MD CRNA: Jeannene Patella, CRNA  1.      Managed anesthesia care. 2.      0.45ml of Shugarcaine was instilled in the eye following the paracentesis.   COMPLICATIONS:  None.   TECHNIQUE:   Stop and chop   DESCRIPTION OF PROCEDURE:  The patient was examined and consented in the preoperative holding area where the aforementioned topical anesthesia was applied to the right eye and then brought back to the Operating Room where the right eye was prepped and draped in the usual sterile ophthalmic fashion and a lid speculum was placed. A paracentesis was created with the side port blade and the anterior chamber was filled with viscoelastic. A near clear corneal incision was performed with the steel keratome. A continuous curvilinear capsulorrhexis was performed with a cystotome followed by the capsulorrhexis forceps. Hydrodissection and hydrodelineation were carried out with BSS on a blunt cannula. The lens was removed in a stop and chop  technique and the remaining cortical material was removed with the irrigation-aspiration handpiece. The capsular bag was inflated with viscoelastic and the Technis [id number]  lens was placed in the capsular bag without complication. The remaining viscoelastic was removed from the eye with the irrigation-aspiration handpiece. The wounds were hydrated. The anterior chamber was flushed with BSS and the eye was inflated to physiologic pressure. 0.88ml of Vigamox was placed in the anterior chamber. The wounds were found to be water tight. The eye was dressed with Combigan. The patient was given protective glasses to wear throughout the day and a shield with which to sleep tonight. The patient was also given drops with which to begin a drop regimen  today and will follow-up with me in one day. Implant Name Type Inv. Item Serial No. Manufacturer Lot No. LRB No. Used Action  LENS IOL TECNIS EYHANCE 22.0 - [id number] Intraocular Lens LENS IOL TECNIS EYHANCE 22.0 [id number]   Right 1 Implanted   Procedure(s): CATARACT EXTRACTION PHACO AND INTRAOCULAR LENS PLACEMENT (IOC) RIGHT 10.10 00:54.8 (Right)  Electronically signed: Birder Mosley 12/30/2020 10:58 AM

## 2020-12-30 NOTE — Transfer of Care (Signed)
Immediate Anesthesia Transfer of Care Note  Patient: Walter Mosley.  Procedure(s) Performed: CATARACT EXTRACTION PHACO AND INTRAOCULAR LENS PLACEMENT (IOC) RIGHT 10.10 00:54.8 (Right: Eye)  Patient Location: PACU  Anesthesia Type: MAC  Level of Consciousness: awake, alert  and patient cooperative  Airway and Oxygen Therapy: Patient Spontanous Breathing and Patient connected to supplemental oxygen  Post-op Assessment: Post-op Vital signs reviewed, Patient's Cardiovascular Status Stable, Respiratory Function Stable, Patent Airway and No signs of Nausea or vomiting  Post-op Vital Signs: Reviewed and stable  Complications: No notable events documented.

## 2020-12-30 NOTE — H&P (Signed)
[facility]   Primary Care Physician:  Alene Mires Elyse Jarvis, MD Ophthalmologist: Dr. Hortense Ramal  Pre-Procedure History & Physical: HPI:  Walter Parcel. is a 80 y.o. male here for cataract surgery.   Past Medical History:  Diagnosis Date   Atrial flutter ([place])    s/p ablation   Cancer ([place])    squamous cell carcimnoma right leg   Chronic atrial fibrillation (HCC)    a. CHA2DS2VASc = 5-->eliquis 5 bid. (h/o epistaxis and gingival bleeding on xarelto).   Coronary artery disease    a. 1993 s/p CABG;  b. 08/2016 Lexi MV: no ischemia/infarct->low risk. EF 55-65%.   Diabetes mellitus without complication ([place])    Dyspnea on exertion    a. 08/2016 Echo: EF 55-60%, no rwma, mild MR, mildly dil LA/RA, mild MR.   Dysrhythmia    GERD (gastroesophageal reflux disease)    Hyperlipidemia    Mixed   Hypertension    Unspecified   MRSA (methicillin resistant staph aureus) culture positive    Osteoarthritis, knee    PAT (paroxysmal atrial tachycardia) (HCC)    PSVT (paroxysmal supraventricular tachycardia) ([place])     Past Surgical History:  Procedure Laterality Date   [place]     by pass   CATARACT EXTRACTION W/PHACO Left 12/16/2020   Procedure: CATARACT EXTRACTION PHACO AND INTRAOCULAR LENS PLACEMENT ([place]) LEFT;  Surgeon: Birder Robson, MD;  Location: [place];  Service: Ophthalmology;  Laterality: Left;  13.65 1:05.0   COLONOSCOPY WITH PROPOFOL N/A 10/14/2016   Procedure: COLONOSCOPY WITH PROPOFOL;  Surgeon: Manya Silvas, MD;  Location: [facility] ENDOSCOPY;  Service: Endoscopy;  Laterality: N/A;   COLONOSCOPY WITH PROPOFOL N/A 10/03/2017   Procedure: COLONOSCOPY WITH PROPOFOL;  Surgeon: [name], MD;  Location: [place];  Service: Endoscopy;  Laterality: N/A;  Diabetic - oral meds   CORONARY ARTERY BYPASS GRAFT  1993   CABG x 5 @ [facility]   ESOPHAGOGASTRODUODENOSCOPY  (EGD) WITH PROPOFOL N/A 10/14/2016   Procedure: ESOPHAGOGASTRODUODENOSCOPY (EGD) WITH PROPOFOL;  Surgeon: Manya Silvas, MD;  Location: [facility] ENDOSCOPY;  Service: Endoscopy;  Laterality: N/A;   HEMORRHOID SURGERY     MOHS SURGERY     POLYPECTOMY N/A 10/03/2017   Procedure: POLYPECTOMY;  Surgeon: [name], MD;  Location: [place];  Service: Endoscopy;  Laterality: N/A;    Prior to Admission medications   Medication Sig Start Date End Date Taking? Authorizing Provider  amLODipine (NORVASC) 10 MG tablet Take 10 mg by mouth daily.    Yes [provider]  apixaban (ELIQUIS) 5 MG TABS tablet Take 5 mg by mouth 2 (two) times daily.   Yes [provider]  atenolol (TENORMIN) 50 MG tablet Take 50 mg by mouth daily.    Yes [provider]  atorvastatin (LIPITOR) 80 MG tablet Take 80 mg by mouth daily.    Yes [provider]  ezetimibe (ZETIA) 10 MG tablet Take 10 mg by mouth daily.   Yes [provider]  hydrochlorothiazide (HYDRODIURIL) 25 MG tablet Take 25 mg by mouth daily.    Yes [provider]  metFORMIN (GLUCOPHAGE) 500 MG tablet Take 500 mg by mouth 2 (two) times daily with a meal.    Yes [provider]  Multiple Vitamins-Minerals (CENTRUM SILVER PO) Take by mouth daily.   Yes [provider]  potassium chloride (K-DUR,KLOR-CON) 10 MEQ tablet Take 10  mEq by mouth 2 (two) times daily.   Yes [provider]    Allergies as of 11/20/2020 - Review Complete 02/04/2020  Allergen Reaction Noted   Xarelto [rivaroxaban]  02/26/2013    Family History  Problem Relation Age of Onset   Heart disease Father    Heart attack Father 46       MI   Coronary artery disease Other     Social History   Socioeconomic History   Marital status: Married    Spouse name: Not on file   Number of children: Not on file   Years of education: Not on file   Highest education level: Not on file  Occupational History    Occupation: Retired  Tobacco Use   Smoking status: Never   Smokeless tobacco: Never  Vaping Use   Vaping Use: Never used  Substance and Sexual Activity   Alcohol use: Yes    Comment: may have 1 beer/month   Drug use: No   Sexual activity: Not on file  Other Topics Concern   Not on file  Social History Narrative   Gets regular exercise.   Social Determinants of Health   Financial Resource Strain: Not on file  Food Insecurity: Not on file  Transportation Needs: Not on file  Physical Activity: Not on file  Stress: Not on file  Social Connections: Not on file  Intimate Partner Violence: Not on file    Review of Systems: See HPI, otherwise negative ROS  Physical Exam: BP (!) 145/78   Pulse 70   Temp (!) 97.3 F (36.3 C) (Temporal)   Resp 16   Ht 6\' 4"  (1.93 m)   Wt 89.8 kg   SpO2 97%   BMI 24.10 kg/m  General:   Alert, cooperative in NAD Head:  Normocephalic and atraumatic. Respiratory:  Normal work of breathing. Cardiovascular:  RRR  Impression/Plan: Walter Flor. is here for cataract surgery.  Risks, benefits, limitations, and alternatives regarding cataract surgery have been reviewed with the patient.  Questions have been answered.  All parties agreeable.   Birder Robson, MD  12/30/2020, 10:36 AM

## 2020-12-31 ENCOUNTER — Encounter: Payer: Self-pay | Admitting: Ophthalmology

## 2021-09-19 IMAGING — CR DG CHEST 2V
3 series · 3 of 3 positions shown · non-contrast
Comparison: Radiographs 06/09/2008.

CLINICAL DATA: Shortness of breath with walking for 1 week. History
of CABG.

EXAM:
CHEST - 2 VIEW

[chest pa (1 of 2)]
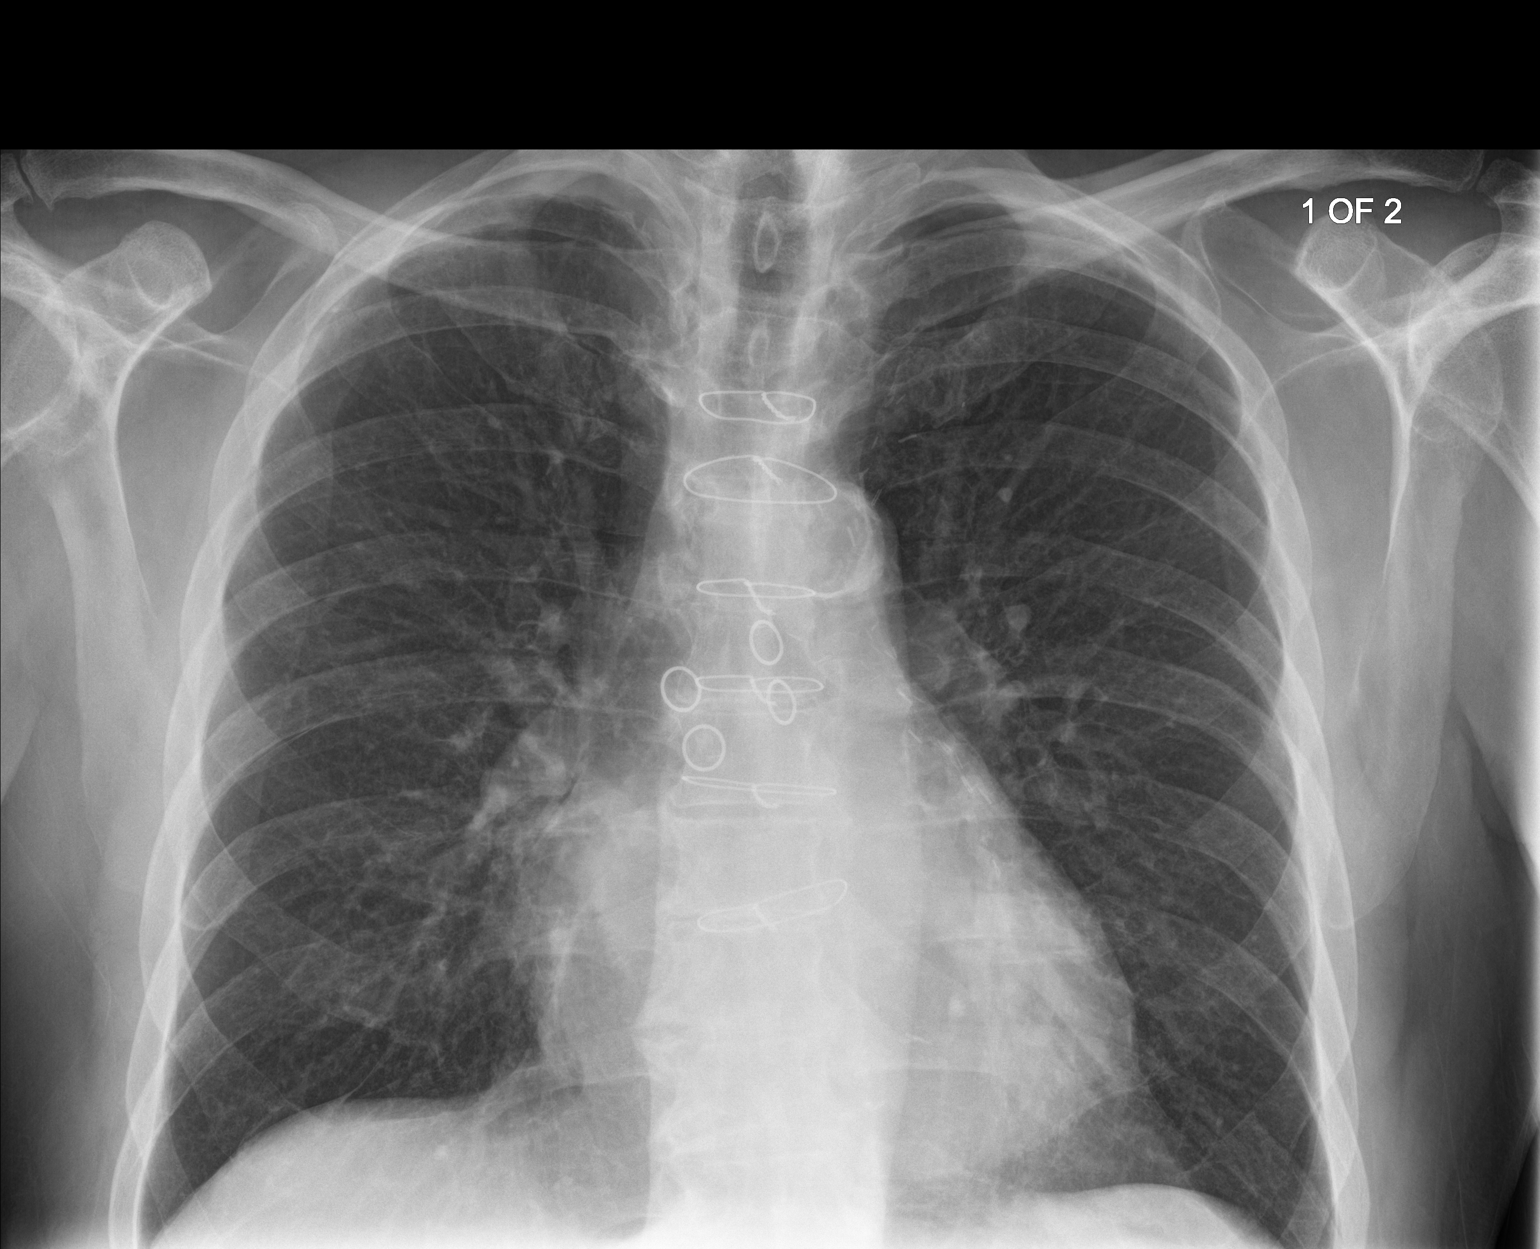

[chest lat]
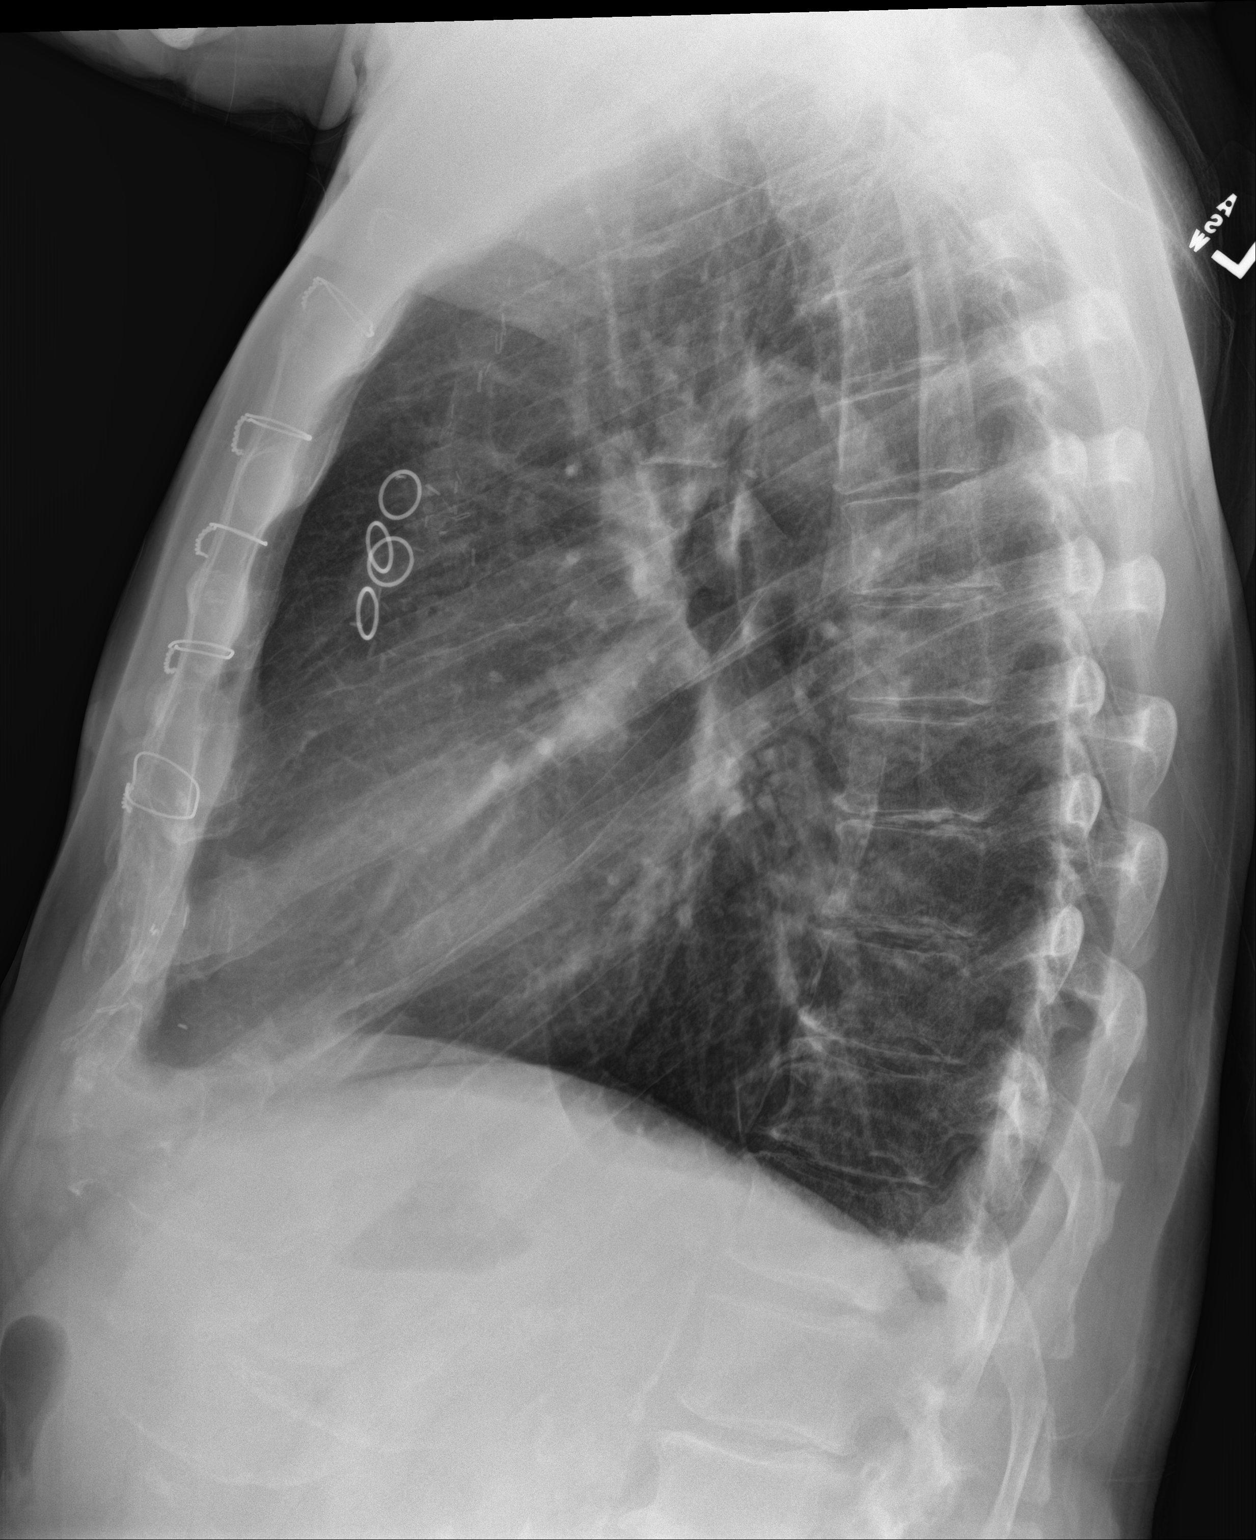

[chest pa (2 of 2)]
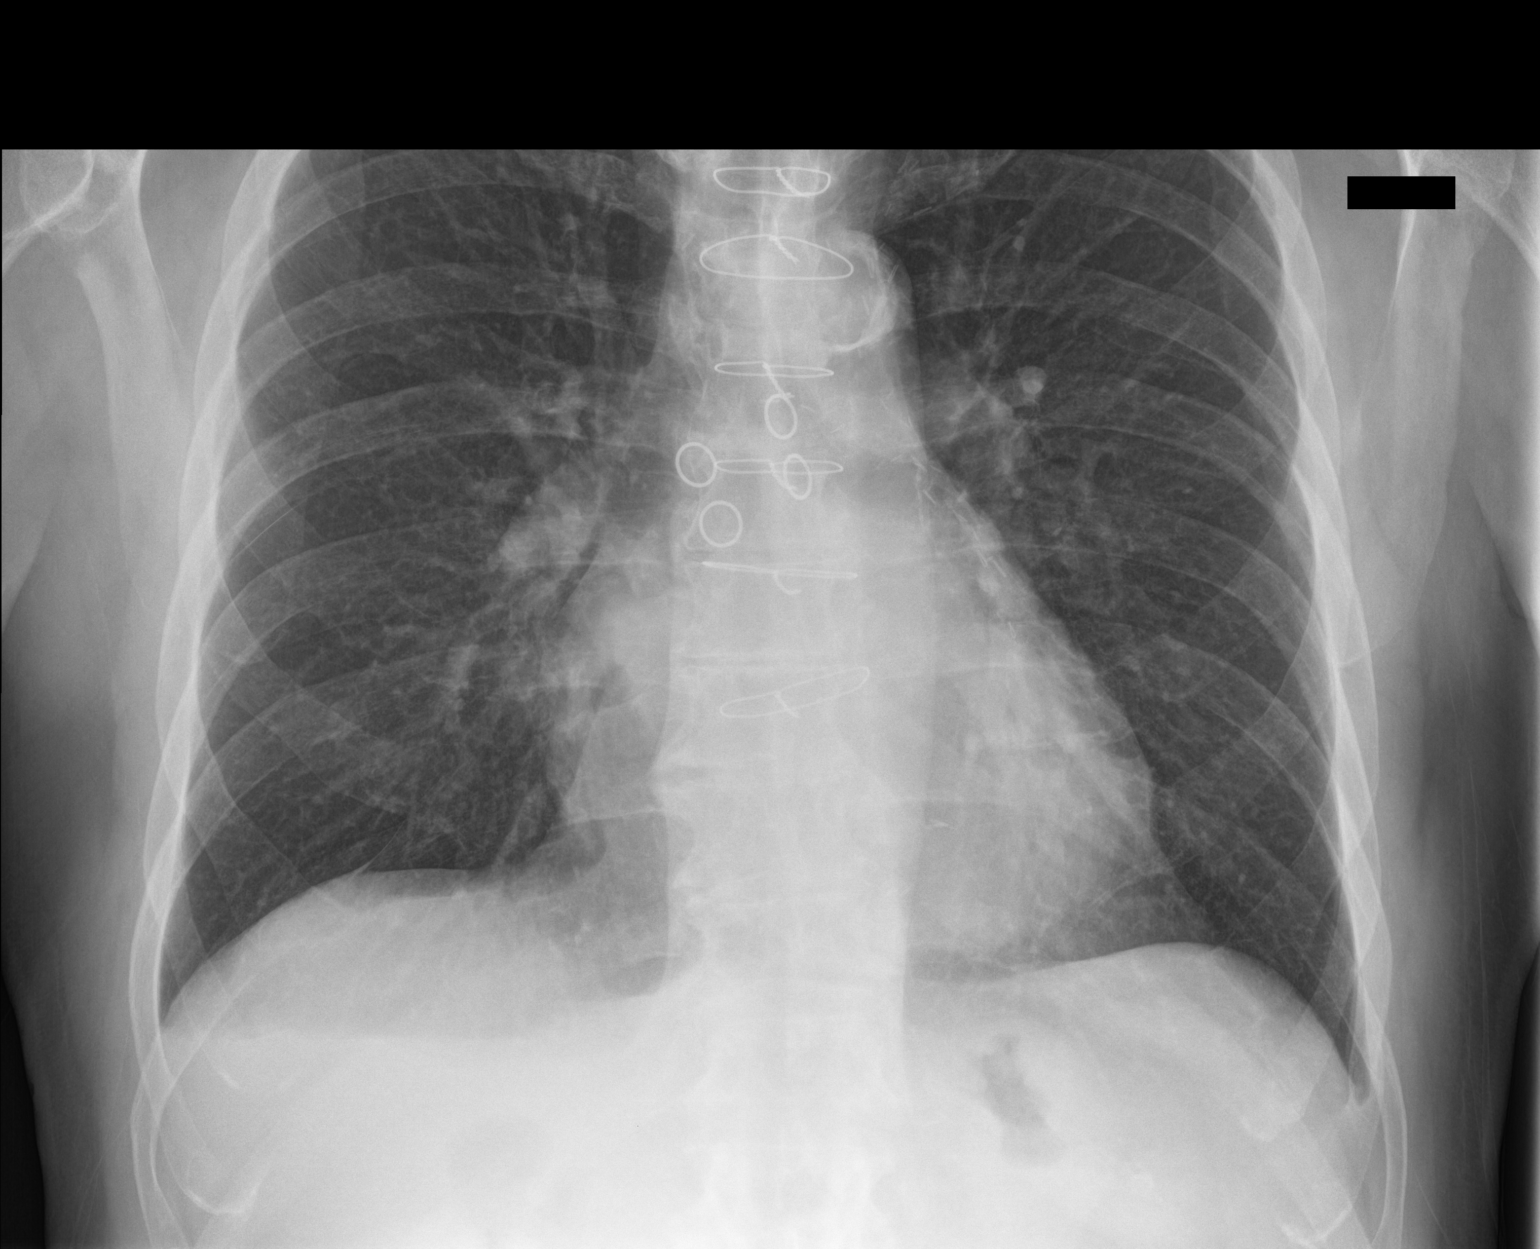

[3 of 3 positions shown; findings below may reference images not displayed]

FINDINGS: The heart size and mediastinal contours are stable status post CABG.
There is moderate aortic atherosclerosis. There is mild scarring at
the lung apices. The lungs are otherwise clear. There is no
significant pleural effusion or pneumothorax. No acute osseous
findings.
IMPRESSION: Stable postoperative chest. No active cardiopulmonary process.

## 2022-08-30 ENCOUNTER — Other Ambulatory Visit: Payer: Self-pay | Admitting: Family Medicine

## 2022-08-30 DIAGNOSIS — M25561 Pain in right knee: Secondary | ICD-10-CM

## 2022-08-30 DIAGNOSIS — G8929 Other chronic pain: Secondary | ICD-10-CM

## 2022-09-30 ENCOUNTER — Ambulatory Visit
Admission: RE | Admit: 2022-09-30 | Discharge: 2022-09-30 | Disposition: A | Discharge status: Home / Self Care | Payer: Medicare Other | Source: Ambulatory Visit | Attending: Family Medicine | Admitting: Family Medicine

## 2022-09-30 ENCOUNTER — Ambulatory Visit
Admission: RE | Admit: 2022-09-30 | Discharge: 2022-09-30 | Disposition: A | Discharge status: Home / Self Care | Payer: Medicare Other | Attending: Family Medicine | Admitting: Family Medicine

## 2022-09-30 DIAGNOSIS — M25561 Pain in right knee: Secondary | ICD-10-CM | POA: Diagnosis not present

## 2022-09-30 DIAGNOSIS — G8929 Other chronic pain: Secondary | ICD-10-CM

## 2022-09-30 DIAGNOSIS — M25562 Pain in left knee: Secondary | ICD-10-CM | POA: Diagnosis present

## 2022-10-22 ENCOUNTER — Ambulatory Visit (INDEPENDENT_AMBULATORY_CARE_PROVIDER_SITE_OTHER): Payer: Medicare Other

## 2022-10-22 ENCOUNTER — Ambulatory Visit
Admission: EM | Admit: 2022-10-22 | Discharge: 2022-10-22 | Disposition: A | Discharge status: Home / Self Care | Payer: Medicare Other | Attending: Family Medicine | Admitting: Family Medicine

## 2022-10-22 DIAGNOSIS — R051 Acute cough: Secondary | ICD-10-CM

## 2022-10-22 DIAGNOSIS — Z20822 Contact with and (suspected) exposure to covid-19: Secondary | ICD-10-CM | POA: Insufficient documentation

## 2022-10-22 LAB — SARS CORONAVIRUS 2 BY RT PCR: SARS Coronavirus 2 by RT PCR: NEGATIVE

## 2022-10-22 MED ORDER — AZITHROMYCIN 250 MG PO TABS: ORAL_TABLET | ORAL | 0 refills | Status: DC

## 2022-10-22 MED ORDER — AZITHROMYCIN 250 MG PO TABS: ORAL_TABLET | ORAL | 0 refills | Status: AC

## 2022-10-22 MED ORDER — BENZONATATE 100 MG PO CAPS: 100.0000 mg | ORAL_CAPSULE | Freq: Three times a day (TID) | ORAL | 0 refills | Status: AC

## 2022-10-22 MED ORDER — PREDNISONE 50 MG PO TABS: 50.0000 mg | ORAL_TABLET | Freq: Every day | ORAL | 0 refills | Status: AC

## 2022-10-22 NOTE — ED Provider Notes (Addendum)
[facility]    CSN: [id number] Arrival date & time: 10/22/22  1219      History   Chief Complaint Chief Complaint  Patient presents with   Cough    HPI Walter Mosley. is a 82 y.o. male.   HPI  History obtained from the patient. Noris presents for cough for the past 2 weeks. Took Nyquil, Mucinex-DM, coricidin BP without relief.  His son's fiance had COVID 2 weeks ago and he was around them. Doesn't feel shortness of breath.    Denies current sore throat, fever, nasal congestion, rhinorrhea, vomiting or diarrhea. No history of smoking    Past Medical History:  Diagnosis Date   Atrial flutter (HCC)    s/p ablation   Cancer (HCC)    squamous cell carcimnoma right leg   Chronic atrial fibrillation (HCC)    a. CHA2DS2VASc = 5-->eliquis 5 bid. (h/o epistaxis and gingival bleeding on xarelto).   Coronary artery disease    a. 1993 s/p CABG;  b. 08/2016 Lexi MV: no ischemia/infarct->low risk. EF 55-65%.   Diabetes mellitus without complication (HCC)    Dyspnea on exertion    a. 08/2016 Echo: EF 55-60%, no rwma, mild MR, mildly dil LA/RA, mild MR.   Dysrhythmia    GERD (gastroesophageal reflux disease)    Hyperlipidemia    Mixed   Hypertension    Unspecified   MRSA (methicillin resistant staph aureus) culture positive    Osteoarthritis, knee    PAT (paroxysmal atrial tachycardia)    PSVT (paroxysmal supraventricular tachycardia)     Patient Active Problem List   Diagnosis Date Noted   Personal history of colonic polyps    Routine cervical smear    Polyp of sigmoid colon    Lower GI bleed    Melena 10/24/2016   Abscess    Cellulitis 02/11/2015   Chronic atrial fibrillation (HCC) 12/31/2013   Pre-syncope 11/16/2012   Coronary artery disease    Hyperlipidemia 01/29/2009   Essential hypertension 01/29/2009   CAD, ARTERY BYPASS GRAFT 01/29/2009   SVT/ PSVT/ PAT 01/29/2009   ATRIAL FLUTTER 01/29/2009    Past Surgical History:  Procedure Laterality Date    CARDIAC CATHETERIZATION  1993   CARDIAC ELECTROPHYSIOLOGY STUDY AND ABLATION     CARDIAC SURGERY     by pass   CATARACT EXTRACTION W/PHACO Left 12/16/2020   Procedure: CATARACT EXTRACTION PHACO AND INTRAOCULAR LENS PLACEMENT (IOC) LEFT;  Surgeon: Galen Manila, MD;  Location: [facility];  Service: Ophthalmology;  Laterality: Left;  13.65 1:05.0   CATARACT EXTRACTION W/PHACO Right 12/30/2020   Procedure: CATARACT EXTRACTION PHACO AND INTRAOCULAR LENS PLACEMENT (IOC) RIGHT 10.10 00:54.8;  Surgeon: Galen Manila, MD;  Location: [facility];  Service: Ophthalmology;  Laterality: Right;   COLONOSCOPY WITH PROPOFOL N/A 10/14/2016   Procedure: COLONOSCOPY WITH PROPOFOL;  Surgeon: Scot Jun, MD;  Location: [facility];  Service: Endoscopy;  Laterality: N/A;   COLONOSCOPY WITH PROPOFOL N/A 10/03/2017   Procedure: COLONOSCOPY WITH PROPOFOL;  Surgeon: Midge Minium, MD;  Location: [facility];  Service: Endoscopy;  Laterality: N/A;  Diabetic - oral meds   CORONARY ARTERY BYPASS GRAFT  1993   CABG x 5 @ [facility]   ESOPHAGOGASTRODUODENOSCOPY (EGD) WITH PROPOFOL N/A 10/14/2016   Procedure: ESOPHAGOGASTRODUODENOSCOPY (EGD) WITH PROPOFOL;  Surgeon: Scot Jun, MD;  Location: [facility];  Service: Endoscopy;  Laterality: N/A;   HEMORRHOID SURGERY     MOHS SURGERY     POLYPECTOMY N/A 10/03/2017  Procedure: POLYPECTOMY;  Surgeon: Midge Minium, MD;  Location: [facility];  Service: Endoscopy;  Laterality: N/A;       Home Medications    Prior to Admission medications   Medication Sig Start Date End Date Taking? Authorizing Provider  benzonatate (TESSALON) 100 MG capsule Take 1 capsule (100 mg total) by mouth every 8 (eight) hours. 10/22/22  Yes Arlester Keehan, DO  predniSONE (DELTASONE) 50 MG tablet Take 1 tablet (50 mg total) by mouth daily for 3 days. 10/22/22 10/25/22 Yes Eknoor Novack, DO  amLODipine (NORVASC) 10 MG tablet Take 10 mg by mouth daily.      [provider]  apixaban (ELIQUIS) 5 MG TABS tablet Take 5 mg by mouth 2 (two) times daily.    [provider]  atenolol (TENORMIN) 50 MG tablet Take 50 mg by mouth daily.     [provider]  atorvastatin (LIPITOR) 80 MG tablet Take 80 mg by mouth daily.     [provider]  azithromycin (ZITHROMAX Z-PAK) 250 MG tablet Take 2 tablets on day 1 then 1 tablet daily 10/22/22   Katha Cabal, DO  ezetimibe (ZETIA) 10 MG tablet Take 10 mg by mouth daily.    [provider]  hydrochlorothiazide (HYDRODIURIL) 25 MG tablet Take 25 mg by mouth daily.     [provider]  metFORMIN (GLUCOPHAGE) 500 MG tablet Take 500 mg by mouth 2 (two) times daily with a meal.     [provider]  Multiple Vitamins-Minerals (CENTRUM SILVER PO) Take by mouth daily.    [provider]  potassium chloride (K-DUR,KLOR-CON) 10 MEQ tablet Take 10 mEq by mouth 2 (two) times daily.    [provider]    Family History Family History  Problem Relation Age of Onset   Heart disease Father    Heart attack Father 98       MI   Coronary artery disease Other     Social History Social History   Tobacco Use   Smoking status: Never   Smokeless tobacco: Never  Vaping Use   Vaping status: Never Used  Substance Use Topics   Alcohol use: Yes    Comment: may have 1 beer/month   Drug use: No     Allergies   Xarelto [rivaroxaban]   Review of Systems Review of Systems: negative unless otherwise stated in HPI.      Physical Exam Triage Vital Signs ED Triage Vitals [10/22/22 1246]  Encounter Vitals Group     BP 135/74     Systolic BP Percentile      Diastolic BP Percentile      Pulse Rate 67     Resp 18     Temp 98.1 F (36.7 C)     Temp Source Oral     SpO2 94 %     Weight      Height      Head Circumference      Peak Flow      Pain Score 0     Pain Loc      Pain Education      Exclude from Growth Chart    No data  found.  Updated Vital Signs BP 135/74 (BP Location: Right Arm)   Pulse 67   Temp 98.1 F (36.7 C) (Oral)   Resp 18   SpO2 94%   Visual Acuity Right Eye Distance:   Left Eye Distance:   Bilateral Distance:    Right Eye Near:  Left Eye Near:    Bilateral Near:     Physical Exam GEN:     alert, non-toxic appearing male in no distress    HENT:  mucus membranes moist, oropharyngeal without lesions or erythema, no tonsillar hypertrophy or exudates, no nasal discharge, bilateral TM normal EYES:   pupils equal and reactive, no scleral injection or discharge NECK:  normal ROM   RESP:  no increased work of breathing, clear to auscultation bilaterally CVS:   regular rate and rhythm Skin:   warm and dry    UC Treatments / Results  Labs (all labs ordered are listed, but only abnormal results are displayed) Labs Reviewed  SARS CORONAVIRUS 2 BY RT PCR    EKG   Radiology DG Chest 2 View  Result Date: 10/22/2022 CLINICAL DATA:  Cough. EXAM: CHEST - 2 VIEW COMPARISON:  Chest x-ray 04/04/2019. FINDINGS: The heart size and mediastinal contours are within normal limits. CABG and median sternotomy. Both lungs are clear. No visible pleural effusions or pneumothorax. No acute osseous abnormality. IMPRESSION: No active cardiopulmonary disease. Electronically Signed   By: Feliberto Harts M.D.   On: 10/22/2022 13:32    Procedures Procedures (including critical care time)  Medications Ordered in UC Medications - No data to display  Initial Impression / Assessment and Plan / UC Course  I have reviewed the triage vital signs and the nursing notes.  Pertinent labs & imaging results that were available during my care of the patient were reviewed by me and considered in my medical decision making (see chart for details).       Pt is a 82 y.o. male who presents for 2 weeks of cough that is not improving.  Edem is  afebrile here without recent antipyretics. Satting 94% on room air. Denies  history of smoking. Overall pt is  non-toxic appearing, well hydrated, without respiratory distress. Pulmonary exam is unremarkable except for cough.  After shared decision making, we will pursue chest x-ray at this time as it currently would not change management.  He had COVID exposure and wants to be sure he doesn't have COVID. Covid test obtained and was negative.   Treat acute bronchitis with steroids and antibiotics as below.  Tessalon perles for cough and allow patient to rest.  Typical duration of symptoms discussed. Return and ED precautions given and patient voiced understanding.   Discussed MDM, treatment plan and plan for follow-up with patient who agrees with plan.      Final Clinical Impressions(s) / UC Diagnoses   Final diagnoses:  Encounter for laboratory testing for COVID-19 virus  Acute cough     Discharge Instructions      We will contact you if your COVID test is positive.  Please quarantine while you wait for the results.  If your test is negative you may resume normal activities.  If your test is positive, quarantine until you are without a fever for at least 24 hours without fever-lowering (Tylenol/Motrin) medications.    If your were prescribed medication, stop by the pharmacy to pick them up.   You can take Tylenol and/or Ibuprofen as needed for fever reduction and pain relief.    For cough: honey 1/2 to 1 teaspoon (you can dilute the honey in water or another fluid).  You can also use guaifenesin and dextromethorphan for cough. You can use a humidifier for chest congestion and cough.  If you don't have a humidifier, you can sit in the bathroom with the hot shower  running.      For sore throat: try warm salt water gargles, Mucinex sore throat cough drops or cepacol lozenges, throat spray, warm tea or water with lemon/honey, popsicles or ice, or OTC cold relief medicine for throat discomfort. You can also purchase chloraseptic spray at the pharmacy or dollar  store.   For congestion: take a daily anti-histamine like Zyrtec, Claritin, and a oral decongestant, such as pseudoephedrine.  You can also use Flonase 1-2 sprays in each nostril daily. Afrin is also a good option, if you do not have high blood pressure.    It is important to stay hydrated: drink plenty of fluids (water, gatorade/powerade/pedialyte, juices, or teas) to keep your throat moisturized and help further relieve irritation/discomfort.    Return or go to the Emergency Department if symptoms worsen or do not improve in the next few days      ED Prescriptions     Medication Sig Dispense Auth. Provider   benzonatate (TESSALON) 100 MG capsule Take 1 capsule (100 mg total) by mouth every 8 (eight) hours. 21 capsule Kai Railsback, DO   azithromycin (ZITHROMAX Z-PAK) 250 MG tablet Take 2 tablets on day 1 then 1 tablet daily 6 tablet Radley Barto, DO   predniSONE (DELTASONE) 50 MG tablet Take 1 tablet (50 mg total) by mouth daily for 3 days. 3 tablet Katha Cabal, DO      PDMP not reviewed this encounter.       Katha Cabal, DO 10/22/22 1500

## 2022-10-22 NOTE — ED Triage Notes (Signed)
Patient presents to UC for cough x 2 weeks. Treating with nyquil, mucinex, coricidin.   Denies fever.

## 2022-10-22 NOTE — Discharge Instructions (Signed)
We will contact you if your COVID test is positive.  Please quarantine while you wait for the results.  If your test is negative you may resume normal activities.  If your test is positive, quarantine until you are without a fever for at least 24 hours without fever-lowering (Tylenol/Motrin) medications.    If your were prescribed medication, stop by the pharmacy to pick them up.   You can take Tylenol and/or Ibuprofen as needed for fever reduction and pain relief.    For cough: honey 1/2 to 1 teaspoon (you can dilute the honey in water or another fluid).  You can also use guaifenesin and dextromethorphan for cough. You can use a humidifier for chest congestion and cough.  If you don't have a humidifier, you can sit in the bathroom with the hot shower running.      For sore throat: try warm salt water gargles, Mucinex sore throat cough drops or cepacol lozenges, throat spray, warm tea or water with lemon/honey, popsicles or ice, or OTC cold relief medicine for throat discomfort. You can also purchase chloraseptic spray at the pharmacy or dollar store.   For congestion: take a daily anti-histamine like Zyrtec, Claritin, and a oral decongestant, such as pseudoephedrine.  You can also use Flonase 1-2 sprays in each nostril daily. Afrin is also a good option, if you do not have high blood pressure.    It is important to stay hydrated: drink plenty of fluids (water, gatorade/powerade/pedialyte, juices, or teas) to keep your throat moisturized and help further relieve irritation/discomfort.    Return or go to the Emergency Department if symptoms worsen or do not improve in the next few days  

## 2023-07-07 ENCOUNTER — Encounter: Payer: Self-pay | Admitting: Cardiology

## 2023-07-07 ENCOUNTER — Ambulatory Visit: Attending: Cardiology | Admitting: Cardiology

## 2023-07-07 VITALS — BP 128/66 | HR 60 | Ht 75.0 in | Wt 198.2 lb

## 2023-07-07 DIAGNOSIS — I25810 Atherosclerosis of coronary artery bypass graft(s) without angina pectoris: Secondary | ICD-10-CM | POA: Diagnosis not present

## 2023-07-07 DIAGNOSIS — I4821 Permanent atrial fibrillation: Secondary | ICD-10-CM

## 2023-07-07 DIAGNOSIS — I10 Essential (primary) hypertension: Secondary | ICD-10-CM

## 2023-07-07 DIAGNOSIS — E785 Hyperlipidemia, unspecified: Secondary | ICD-10-CM | POA: Diagnosis not present

## 2023-07-07 DIAGNOSIS — I2510 Atherosclerotic heart disease of native coronary artery without angina pectoris: Secondary | ICD-10-CM | POA: Diagnosis not present

## 2023-07-07 DIAGNOSIS — R7303 Prediabetes: Secondary | ICD-10-CM | POA: Insufficient documentation

## 2023-07-07 DIAGNOSIS — I471 Supraventricular tachycardia, unspecified: Secondary | ICD-10-CM

## 2023-07-07 NOTE — Progress Notes (Signed)
 Cardiology Office Note:  .   Date:  07/07/2023  ID:  Walter Oven., DOB 1940-06-23, MRN [id number] PCP: Walter Fleeting, MD  [facility] Providers Cardiologist:  Walter Bears, MD     Chief Complaint  Patient presents with   Follow-up    Over 3 years   Coronary Artery Disease   Cardiomyopathy   Atrial Fibrillation    Patient Profile: .     Walter Oven. is a fairly robust 83 y.o. male former smoker with a PMH noted below who presents here to reestablish intermittent cardiology care at the request of Walter Mosley*.  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 was last seen by Walter Shields, NP in October 2021, and has been followed by Dr. Lorine Mosley in the past.  Really only reported remittent fluttering sensation a few days prior to arrival but it got better when he rubs his chest.  Nothing really bothersome.  Noted left knee pain.  And had a 10 pound weight loss.  Labs are being monitored by PCP.  No major changes made.  Subjective  Discussed the use of AI scribe software for clinical note transcription with the patient, who gave verbal consent to proceed.  History of Present Illness   Walter Mosley. "Walter Mosley" is an 83 year old male with coronary artery bypass surgery and"permanent" atrial fibrillation who presents for a routine cardiology follow-up.  He has a history of coronary artery bypass surgery performed in 1993 with no stents placed. He is asymptomatic with no chest pain, pressure, or tightness, and no shortness of breath, even when lying flat or waking up. He experiences decreased ability to walk long distances, primarily due to right knee pain, for which he needs a knee replacement.  He has a history of atrial fibrillation but is unsure if it is persistent or intermittent. He does not feel any irregular heartbeats or heart racing. He is currently on  Eliquis for stroke prevention and atenolol 50 mg for heart rate control. No symptoms of dizziness, lightheadedness, or syncope are present.  He is on multiple medications for cardiovascular health, including Norvasc 10 mg and HCTZ 25 mg for blood pressure, atorvastatin 80 mg and Zetia 10 mg for cholesterol management. He also takes metformin, which he has reduced to once daily, and a potassium supplement twice daily. No recent changes in his medication regimen are reported. No symptoms of gastrointestinal bleeding, such as blood in stools or dark tarry stools, and no history of epistaxis. He is not diabetic but has been considered borderline for the past three to four years. He undergoes regular lab work every three to six months through his primary care provider.  Socially, he does not smoke and drinks alcohol occasionally, about one beer per month. He is allergic to coffee, tea, and lettuce, which cause diarrhea, so he avoids them.     Cardiovascular ROS: no chest pain or dyspnea on exertion positive for - rare sensations of irregular heartbeats.  But no rapid irregular rhythms. negative for - edema, orthopnea, palpitations, paroxysmal nocturnal dyspnea, rapid heart rate, shortness of breath, or lightheadedness dizziness, syncope or near significant TIA or emesis BX.  ROS:  Review of Systems -getting over a cold    Objective    Studies Reviewed: [name]   EKG Interpretation Date/Time:  Thursday July 07 2023 08:24:34 EDT  Ventricular Rate:  60 PR Interval:    QRS Duration:  104 QT Interval:  448 QTC Calculation: 448 R Axis:   80  Text Interpretation: Atrial fibrillation Abnormal QRS-T angle, consider primary T wave abnormality When compared with ECG of 04-Apr-2019 18:25, No significant change was found Confirmed by Walter Mosley ([id number]) on 07/07/2023 8:46:35 AM    ECHO 02/2020:  1. Left ventricular ejection fraction, by visual estimation, is 55 to 60%. The left ventricle has normal function.  There is no left ventricular  hypertrophy.   2. Left ventricular diastolic parameters are indeterminate.   3. The left ventricle has no regional wall motion abnormalities.   4. Global right ventricle has normal systolic function.The right ventricular size is normal. No increase in right ventricular wall  thickness.   5. Left atrial size was moderately dilated.   6. Mildly elevated pulmonary artery systolic pressure.   7. The inferior vena cava is dilated in size with >50% respiratory variability, suggesting right atrial pressure of 8 mmHg.   Myoview 08/2016:  There was no ST segment deviation noted during stress. No T wave inversion was noted during stress. The study is normal. This is a low risk study. The left ventricular ejection fraction is normal (55-65%).  Lab Results  Component Value Date   CHOL 134 05/27/2016   HDL 32 (L) 05/27/2016   LDLCALC 79 05/27/2016   TRIG 116 05/27/2016   CHOLHDL 4.2 05/27/2016    No results found for: "HGBA1C"   Risk Assessment/Calculations:    CHA2DS2-VASc Score = 5   This indicates a 7.2% annual risk of stroke. The patient's score is based upon: CHF History: 0 HTN History: 1 Diabetes History: 1 Stroke History: 0 Vascular Disease History: 1 Age Score: 2 Gender Score: 0   ON DOAC          Physical Exam:   VS:  BP 128/66   Pulse 60   Ht 6\' 3"  (1.905 m)   Wt 198 lb 3.2 oz (89.9 kg)   SpO2 95%   BMI 24.77 kg/m    Wt Readings from Last 3 Encounters:  07/07/23 198 lb 3.2 oz (89.9 kg)  12/30/20 198 lb (89.8 kg)  12/16/20 200 lb 3.2 oz (90.8 kg)    GEN: Well nourished, well developed in no acute distress; healthy appearing NECK: No JVD; No carotid bruits CARDIAC: Irreg Irreg; Normal S1, S2; , no murmurs, rubs, gallops RESPIRATORY:  Clear to auscultation without rales, wheezing or rhonchi ; nonlabored, good air movement. ABDOMEN: Soft, non-tender, non-distended EXTREMITIES:  No edema; No deformity      ASSESSMENT AND PLAN: .     Problem List Items Addressed This Visit       Cardiology Problems   CAD, ARTERY BYPASS GRAFT (Chronic)   Most recent Myoview in 2018 was nonischemic.  In the absence symptoms, would prefer to avoid follow-up testing.      Coronary artery disease involving native heart without angina pectoris (Chronic)   Asymptomatic post-CABG coronary artery disease.  Last Myoview 2018 - Non-ischemic.  No routine stress tests unless symptomatic. Acknowledged potential graft failure over time. - Continue atorvastatin 80 mg and Zetia 10 mg for cholesterol management. - Continue Atenolol 50 mg and amlodipine for antianginal effect. - No routine stress tests unless symptoms develop.      Essential hypertension - Primary (Chronic)   Hypertension well-controlled with Norvasc and HCTZ. - Continue Norvasc a 10 mg daily nd Atenolol 50 mL daily, along with HCTZ  25 mg for blood pressure control.      Relevant Orders   EKG 12-Lead (Completed)   Hyperlipidemia with target LDL less than 70 (Chronic)   Hyperlipidemia managed with atorvastatin 80 mg and Zetia 10 . Regular cholesterol monitoring advised. - Continue atorvastatin and Zetia. - Ensure regular cholesterol level checks through primary care.      Permanent atrial fibrillation (HCC) (Chronic)   Chronic asymptomatic "permanent" atrial fibrillation managed with Eliquis and atenolol. - Continue Eliquis 5 mg BID for stroke prevention. - Continue atenolol 50 mg daily for heart rate control.      SVT/ PSVT/ PAT   No symptoms of breakthrough spells, would simply continue beta-blocker at current dose Avoid triggers.  Maintain adequate hydration.        Other   Prediabetes (Chronic)   Borderline diabetes managed with low dose metformin.  Reports suboptimal adherence to dosing regimen. - Continue metformin as prescribed. - Ensure regular monitoring of blood glucose and HbA1c levels through primary care.        Follow-up Stable with no new  symptoms. Regular follow-up with primary care for lab checks and chronic condition management. - Request primary care to send lab results to cardiology for records. - Schedule follow-up with cardiology in two years unless symptoms develop;  Follow-Up: Return in about 2 years (around 07/06/2025) for Routine follow up with me, [facility] office. \    Signed, Marykay Lex, MD, MS Walter Mosley, M.D., M.S. Interventional Cardiologist  [facility]  Pager # [phone] Phone # [phone] [address] [place], [place] [address]

## 2023-07-07 NOTE — Assessment & Plan Note (Addendum)
 Most recent Myoview in 2018 was nonischemic.  In the absence symptoms, would prefer to avoid follow-up testing.

## 2023-07-07 NOTE — Assessment & Plan Note (Signed)
 Borderline diabetes managed with low dose metformin.  Reports suboptimal adherence to dosing regimen. - Continue metformin as prescribed. - Ensure regular monitoring of blood glucose and HbA1c levels through primary care.

## 2023-07-07 NOTE — Patient Instructions (Signed)
 Medication Instructions:  No changes at this time.   *If you need a refill on your cardiac medications before your next appointment, please call your pharmacy*   Lab Work: None  If you have labs (blood work) drawn today and your tests are completely normal, you will receive your results only by: MyChart Message (if you have MyChart) OR A paper copy in the mail If you have any lab test that is abnormal or we need to change your treatment, we will call you to review the results.   Testing/Procedures: None   Follow-Up: At [facility], you and your health needs are our priority.  As part of our continuing mission to provide you with exceptional heart care, we have created designated Provider Care Teams.  These Care Teams include your primary Cardiologist (physician) and Advanced Practice Providers (APPs -  Physician Assistants and Nurse Practitioners) who all work together to provide you with the care you need, when you need it.   Your next appointment:   2 year(s)  Provider:   You may see Dr. Herbie Baltimore or one of the following Advanced Practice Providers on your designated Care Team:   Nicolasa Ducking, NP Eula Listen, PA-C Cadence Fransico Michael, PA-C Charlsie Quest, NP Carlos Levering, NP

## 2023-07-07 NOTE — Assessment & Plan Note (Signed)
 Asymptomatic post-CABG coronary artery disease.  Last Myoview 2018 - Non-ischemic.  No routine stress tests unless symptomatic. Acknowledged potential graft failure over time. - Continue atorvastatin 80 mg and Zetia 10 mg for cholesterol management. - Continue Atenolol 50 mg and amlodipine for antianginal effect. - No routine stress tests unless symptoms develop.

## 2023-07-07 NOTE — Assessment & Plan Note (Signed)
 Hyperlipidemia managed with atorvastatin 80 mg and Zetia 10 . Regular cholesterol monitoring advised. - Continue atorvastatin and Zetia. - Ensure regular cholesterol level checks through primary care.

## 2023-07-07 NOTE — Assessment & Plan Note (Signed)
 No symptoms of breakthrough spells, would simply continue beta-blocker at current dose Avoid triggers.  Maintain adequate hydration.

## 2023-07-07 NOTE — Assessment & Plan Note (Signed)
 Hypertension well-controlled with Norvasc and HCTZ. - Continue Norvasc a 10 mg daily nd Atenolol 50 mL daily, along with HCTZ 25 mg for blood pressure control.

## 2023-07-07 NOTE — Assessment & Plan Note (Signed)
 Chronic asymptomatic "permanent" atrial fibrillation managed with Eliquis and atenolol. - Continue Eliquis 5 mg BID for stroke prevention. - Continue atenolol 50 mg daily for heart rate control.

## 2023-07-15 ENCOUNTER — Ambulatory Visit: Payer: Medicare Other | Admitting: Cardiology

## 2023-08-09 ENCOUNTER — Encounter: Payer: Self-pay | Admitting: Cardiology

## 2023-09-29 ENCOUNTER — Ambulatory Visit: Admitting: Cardiology
